# Patient Record
Sex: Female | Born: 1980 | Race: Black or African American | Hispanic: No | Marital: Single | Smoking: Never smoker
Health system: Southern US, Community
[De-identification: ages and names within clinical notes are randomized; demographics above are authoritative.]

## PROBLEM LIST (undated history)

## (undated) DIAGNOSIS — L309 Dermatitis, unspecified: Secondary | ICD-10-CM

## (undated) DIAGNOSIS — E669 Obesity, unspecified: Secondary | ICD-10-CM

## (undated) DIAGNOSIS — E079 Disorder of thyroid, unspecified: Secondary | ICD-10-CM

## (undated) DIAGNOSIS — I10 Essential (primary) hypertension: Secondary | ICD-10-CM

---

## 1999-01-19 ENCOUNTER — Emergency Department (HOSPITAL_COMMUNITY): Admission: EM | Admit: 1999-01-19 | Discharge: 1999-01-19 | Payer: Self-pay | Admitting: Emergency Medicine

## 2000-11-29 ENCOUNTER — Other Ambulatory Visit: Admission: RE | Admit: 2000-11-29 | Discharge: 2000-11-29 | Payer: Self-pay | Admitting: Obstetrics

## 2008-05-17 ENCOUNTER — Observation Stay (HOSPITAL_COMMUNITY): Admission: AD | Admit: 2008-05-17 | Discharge: 2008-05-18 | Payer: Self-pay | Admitting: Gynecology

## 2008-05-17 ENCOUNTER — Ambulatory Visit: Payer: Self-pay | Admitting: Gynecology

## 2009-11-07 ENCOUNTER — Emergency Department (HOSPITAL_BASED_OUTPATIENT_CLINIC_OR_DEPARTMENT_OTHER): Admission: EM | Admit: 2009-11-07 | Discharge: 2009-11-07 | Payer: Self-pay | Admitting: Emergency Medicine

## 2011-03-28 NOTE — Discharge Summary (Signed)
Cindy Le, Cindy Le               ACCOUNT NO.:  [id number]   MEDICAL RECORD NO.:  [id number]          PATIENT TYPE:  INP   LOCATION:  9303                          FACILITY:  [facility]   PHYSICIAN:  Ginger Carne, MD  DATE OF BIRTH:  October 06, 1981   DATE OF ADMISSION:  05/17/2008  DATE OF DISCHARGE:  05/18/2008                               DISCHARGE SUMMARY   REASON FOR HOSPITALIZATION:  Vaginal bleeding.   IN-HOSPITAL PROCEDURES:  Pelvic sonogram and quantitative hCG and CBC.   FINAL DIAGNOSIS:  Failed gestation.   HOSPITAL COURSE:  This patient is a 30 year old African American female  who presented to the Maternity Admission Unit on May 17, 2008, with  complaint of vaginal bleeding.  Pelvic sonogram revealed no evidence of  an intrauterine gestation.  There was no evidence of any adnexal masses  or free fluid in the pelvis.  The patient's quantitative hCG was 7488  and the hemoglobin of 11.9.  She denied abdominal pain, tenderness, or  lateralization of pain to either lower quadrant.  She is Rh positive.   The patient was observed during the evening and on the morning of May 18, 2008.  The patient had a repeat quantitative hCG, which went down to  4068 and a hemoglobin remained relatively stable from 11.9 on May 17, 2008 to 11.2 in the morning of May 18, 2008.  Her abdomen has remained  soft without tenderness.  She denies pelvic pain or lateralizing  discomfort.  She still has scant flow.   At this time, the patient will be discharged.  She had an appointment in  2 days to be followed by [facility] and I asked the  patient to contact them today and be followed up by their service in  approximately 1 week.  She was advised to return to the MAU if she has  severe lateralizing discomfort in the left lower quadrant to right lower  quadrant or generalized abdominal pain that is different from menstrual  cramping secondary to bleeding.  The patient understands,  although the  likelihood of an ectopic pregnancy is nil, it is not completely ruled  out, and the patient needs to take precautions in the event she develops  increasing pelvic pain.  She was asked to continue her medication for  hypothyroidism, levothyroxine 100 mcg daily.  All questions were  answered to the satisfaction of the patient.  The patient verbalized  understanding of same.      Ginger Carne, MD  Electronically Signed     SHB/MEDQ  D:  05/18/2008  T:  05/18/2008  Job:  [id number]

## 2011-08-10 LAB — URINE MICROSCOPIC-ADD ON

## 2011-08-10 LAB — CBC
HCT: 32.5 — ABNORMAL LOW
MCHC: 34.5
MCHC: 34.7
MCV: 98.7
Platelets: 233
Platelets: 273
RDW: 13.5

## 2011-08-10 LAB — URINALYSIS, ROUTINE W REFLEX MICROSCOPIC
Ketones, ur: 15 — AB
Leukocytes, UA: NEGATIVE
Nitrite: NEGATIVE
Specific Gravity, Urine: >1.03 — ABNORMAL HIGH
Urobilinogen, UA: 0.2
pH: 6

## 2011-08-10 LAB — HCG, QUANTITATIVE, PREGNANCY: hCG, Beta Chain, Quant, S: 7488 — ABNORMAL HIGH

## 2011-08-10 LAB — GC/CHLAMYDIA PROBE AMP, GENITAL
Chlamydia, DNA Probe: NEGATIVE
GC Probe Amp, Genital: NEGATIVE

## 2012-04-24 ENCOUNTER — Encounter (HOSPITAL_BASED_OUTPATIENT_CLINIC_OR_DEPARTMENT_OTHER): Payer: Self-pay | Admitting: *Deleted

## 2012-04-24 ENCOUNTER — Emergency Department (HOSPITAL_BASED_OUTPATIENT_CLINIC_OR_DEPARTMENT_OTHER)
Admission: EM | Admit: 2012-04-24 | Discharge: 2012-04-25 | Disposition: A | Discharge status: Home / Self Care | Payer: No Typology Code available for payment source | Attending: Emergency Medicine | Admitting: Emergency Medicine

## 2012-04-24 DIAGNOSIS — K645 Perianal venous thrombosis: Secondary | ICD-10-CM

## 2012-04-24 DIAGNOSIS — E079 Disorder of thyroid, unspecified: Secondary | ICD-10-CM | POA: Insufficient documentation

## 2012-04-24 HISTORY — DX: Disorder of thyroid, unspecified: E07.9

## 2012-04-24 NOTE — ED Notes (Signed)
Pt c/o abscess to rectal area x 3 days

## 2012-04-24 NOTE — ED Notes (Signed)
Pt reports boil at anus.  Denies pain with BM.  Reports pain when sitting. Denies any bleeding.  No hx of same.

## 2012-04-25 MED ORDER — LIDOCAINE HCL 4 % EX SOLN
CUTANEOUS | Status: AC
  Filled 2012-04-25: qty 50

## 2012-04-25 NOTE — Discharge Instructions (Signed)

## 2012-04-25 NOTE — ED Provider Notes (Signed)
History     CSN: [id number]  Arrival date & time 04/24/12  2332   First MD Initiated Contact with Patient 04/25/12 0000      Chief Complaint  Patient presents with  . Boil     (Consider location/radiation/quality/duration/timing/severity/associated sxs/prior treatment) HPI This is a 31 year old black female with a three-day history of what she believes is an abscess of her anus. There is swelling and tenderness, worse with palpation or movement. She has been able to move her bowels without significant pain but has been constipated. She denies a history of similar symptoms in the past. She has not done any specific treatment for this.  Past Medical History  Diagnosis Date  . Thyroid disease     History reviewed. No pertinent past surgical history.  History reviewed. No pertinent family history.  History  Substance Use Topics  . Smoking status: Never Smoker   . Smokeless tobacco: Not on file  . Alcohol Use: No    OB History    Grav Para Term Preterm Abortions TAB SAB Ect Mult Living                  Review of Systems  All other systems reviewed and are negative.    Allergies  Review of patient's allergies indicates no known allergies.  Home Medications   Current Outpatient Rx  Name Route Sig Dispense Refill  . LEVOTHYROXINE SODIUM 150 MCG PO TABS Oral Take 150 mcg by mouth daily.      BP 155/92  Pulse 100  Temp 98.8 F (37.1 C) (Oral)  Resp 16  Ht 5\' 4"  (1.626 m)  Wt 217 lb (98.431 kg)  BMI 37.25 kg/m2  SpO2 100%  LMP 03/27/2012  Physical Exam General: Well-developed, well-nourished female in no acute distress; appearance consistent with age of record HENT: normocephalic, atraumatic Eyes: pupils equal round and reactive to light; extraocular muscles intact Neck: supple Heart: regular rate and rhythm Lungs: clear to auscultation bilaterally Abdomen: soft; nondistended; bowel sounds present Rectal: Thrombosed hemorrhoid at 3:00 in the lithotomy  position Extremities: No deformity; full range of motion Neurologic: Awake, alert and oriented; motor function intact in all extremities and symmetric; no facial droop Skin: Warm and dry Psychiatric: Normal mood and affect    ED Course  Procedures (including critical care time)  INCISION AND DRAINAGE Performed by: Hanley Seamen Consent: Verbal consent obtained. Risks and benefits: risks, benefits and alternatives were discussed Type: Thrombosed hemorrhoid   Body area: Anus, 3:00 in the lithotomy position  Anesthesia: Topical application of 4% lidocaine solution; local infiltration of 2% lidocaine with epinephrine  Anesthetic total: 0.5 ml  Complexity: simple Elliptical section of roof of hemorrhoid removed with scissors; thrombus removed with forceps   Bleeding: Minimal  Patient tolerance: Patient tolerated the procedure well with no immediate complications.      MDM          Hanley Seamen, MD 04/25/12 [phone]

## 2012-11-18 ENCOUNTER — Emergency Department (HOSPITAL_BASED_OUTPATIENT_CLINIC_OR_DEPARTMENT_OTHER)
Admission: EM | Admit: 2012-11-18 | Discharge: 2012-11-18 | Disposition: A | Discharge status: Home / Self Care | Payer: No Typology Code available for payment source | Attending: Emergency Medicine | Admitting: Emergency Medicine

## 2012-11-18 ENCOUNTER — Encounter (HOSPITAL_BASED_OUTPATIENT_CLINIC_OR_DEPARTMENT_OTHER): Payer: Self-pay | Admitting: *Deleted

## 2012-11-18 DIAGNOSIS — R21 Rash and other nonspecific skin eruption: Secondary | ICD-10-CM | POA: Insufficient documentation

## 2012-11-18 DIAGNOSIS — T7840XA Allergy, unspecified, initial encounter: Secondary | ICD-10-CM

## 2012-11-18 DIAGNOSIS — L509 Urticaria, unspecified: Secondary | ICD-10-CM | POA: Insufficient documentation

## 2012-11-18 DIAGNOSIS — T4995XA Adverse effect of unspecified topical agent, initial encounter: Secondary | ICD-10-CM | POA: Insufficient documentation

## 2012-11-18 DIAGNOSIS — Z79899 Other long term (current) drug therapy: Secondary | ICD-10-CM | POA: Insufficient documentation

## 2012-11-18 DIAGNOSIS — L299 Pruritus, unspecified: Secondary | ICD-10-CM | POA: Insufficient documentation

## 2012-11-18 DIAGNOSIS — E079 Disorder of thyroid, unspecified: Secondary | ICD-10-CM | POA: Insufficient documentation

## 2012-11-18 MED ORDER — FAMOTIDINE 20 MG PO TABS
20.0000 mg | ORAL_TABLET | Freq: Once | ORAL | Status: AC
  Administered 2012-11-18: 20 mg via ORAL
  Filled 2012-11-18: qty 1

## 2012-11-18 MED ORDER — DIPHENHYDRAMINE HCL 25 MG PO CAPS
25.0000 mg | ORAL_CAPSULE | Freq: Once | ORAL | Status: AC
  Administered 2012-11-18: 25 mg via ORAL
  Filled 2012-11-18: qty 1

## 2012-11-18 MED ORDER — PREDNISONE 20 MG PO TABS: ORAL_TABLET | ORAL | Status: DC

## 2012-11-18 MED ORDER — PREDNISONE 50 MG PO TABS
60.0000 mg | ORAL_TABLET | Freq: Once | ORAL | Status: AC
  Administered 2012-11-18: 60 mg via ORAL
  Filled 2012-11-18: qty 1

## 2012-11-18 MED ORDER — FAMOTIDINE 20 MG PO TABS: 20.0000 mg | ORAL_TABLET | Freq: Two times a day (BID) | ORAL | Status: DC

## 2012-11-18 NOTE — ED Provider Notes (Signed)
History     CSN: [id number]  Arrival date & time 11/18/12  0143   First MD Initiated Contact with Patient 11/18/12 [phone]      Chief Complaint  Patient presents with  . Allergic Reaction    (Consider location/radiation/quality/duration/timing/severity/associated sxs/prior treatment) Patient is a 32 y.o. female presenting with allergic reaction. The history is provided by the patient.  Allergic Reaction The primary symptoms are  urticaria. The primary symptoms do not include wheezing, shortness of breath, cough, nausea or angioedema. The current episode started more than 2 days ago. The problem has not changed since onset.This is a new problem.  The urticaria began more than 2 days ago. The urticaria has been unchanged since its onset. Urticaria is a new problem. Urticaria is located on the left arm and right arm. The onset of urticaria was associated with scratching of the skin.  Associated with: unable to say. Significant symptoms also include itching. Significant symptoms that are not present include eye redness.    Past Medical History  Diagnosis Date  . Thyroid disease     History reviewed. No pertinent past surgical history.  No family history on file.  History  Substance Use Topics  . Smoking status: Never Smoker   . Smokeless tobacco: Not on file  . Alcohol Use: No    OB History    Grav Para Term Preterm Abortions TAB SAB Ect Mult Living                  Review of Systems  Eyes: Negative for redness.  Respiratory: Negative for cough, shortness of breath and wheezing.   Gastrointestinal: Negative for nausea.  Skin: Positive for itching.  All other systems reviewed and are negative.    Allergies  Review of patient's allergies indicates no known allergies.  Home Medications   Current Outpatient Rx  Name  Route  Sig  Dispense  Refill  . LEVOTHYROXINE SODIUM 150 MCG PO TABS   Oral   Take 150 mcg by mouth daily.           BP 153/97  Pulse 88  Temp  97.8 F (36.6 C) (Oral)  Resp 20  Ht 5\' 5"  (1.651 m)  Wt 230 lb (104.327 kg)  BMI 38.27 kg/m2  SpO2 100%  LMP 11/15/2012  Physical Exam  Constitutional: She is oriented to person, place, and time. She appears well-developed and well-nourished. No distress.  HENT:  Head: Normocephalic and atraumatic.  Mouth/Throat: Oropharynx is clear and moist.       No swelling of the lips tongue of uvula  Eyes: Conjunctivae normal are normal. Pupils are equal, round, and reactive to light.  Neck: Normal range of motion. Neck supple.  Cardiovascular: Normal rate, regular rhythm and intact distal pulses.   Pulmonary/Chest: Effort normal and breath sounds normal. No stridor. She has no wheezes. She has no rales.  Abdominal: Soft. Bowel sounds are normal. There is no tenderness. There is no rebound and no guarding.  Musculoskeletal: Normal range of motion. She exhibits no edema.  Neurological: She is alert and oriented to person, place, and time.  Skin: Skin is warm and dry. There is pallor.       Mild urticaria of B forearms  Psychiatric: She has a normal mood and affect.    ED Course  Procedures (including critical care time)  Labs Reviewed - No data to display No results found.   No diagnosis found.    MDM  Will treat as allergic  reaction follow up with your family doctor for ongoing care.  Return for worsening symptoms such as shortness of breath wheezing or swelling or the lips or tongue       Manish Ruggiero K Dona Walby-Rasch, MD 11/18/12 [phone]

## 2012-11-18 NOTE — ED Notes (Signed)
MD at bedside. 

## 2012-11-18 NOTE — ED Notes (Addendum)
Pt. States that she developed an allergic reaction type rash that started yesterday. Worse on arms bilateral. Faint  Redness and flat hives noted to arms bilateral. Pt. States rash is also on her legs bilateral. Denies sob. Denies any new products, foods or meds. Denies any fevers/sorethroat. Eyes swollen bilateral on exam.

## 2012-11-18 NOTE — ED Notes (Signed)
Rx x 2 given for pepcid and prednisone- instructed to take otc benadryl per EDP Palumbo

## 2012-11-30 ENCOUNTER — Encounter (HOSPITAL_BASED_OUTPATIENT_CLINIC_OR_DEPARTMENT_OTHER): Payer: Self-pay | Admitting: *Deleted

## 2012-11-30 ENCOUNTER — Emergency Department (HOSPITAL_BASED_OUTPATIENT_CLINIC_OR_DEPARTMENT_OTHER)
Admission: EM | Admit: 2012-11-30 | Discharge: 2012-11-30 | Disposition: A | Discharge status: Home / Self Care | Payer: No Typology Code available for payment source | Attending: Emergency Medicine | Admitting: Emergency Medicine

## 2012-11-30 DIAGNOSIS — R51 Headache: Secondary | ICD-10-CM | POA: Insufficient documentation

## 2012-11-30 DIAGNOSIS — R110 Nausea: Secondary | ICD-10-CM | POA: Insufficient documentation

## 2012-11-30 DIAGNOSIS — E039 Hypothyroidism, unspecified: Secondary | ICD-10-CM | POA: Insufficient documentation

## 2012-11-30 DIAGNOSIS — Z760 Encounter for issue of repeat prescription: Secondary | ICD-10-CM | POA: Insufficient documentation

## 2012-11-30 MED ORDER — LEVOTHYROXINE SODIUM 150 MCG PO TABS: 150.0000 ug | ORAL_TABLET | Freq: Every day | ORAL | Status: DC

## 2012-11-30 NOTE — ED Provider Notes (Signed)
History     CSN: [id number]  Arrival date & time 11/30/12  1203   First MD Initiated Contact with Patient 11/30/12 1329      Chief Complaint  Patient presents with  . Fatigue    (Consider location/radiation/quality/duration/timing/severity/associated sxs/prior treatment) HPI Comments: Patient with a history of thyroidectomy and hypothyroidism presents with 2 weeks of fatigue.  She in currently in the process of switching physicians and has been unable to get her prescription for synthroid 150 mcg.  She has not taken her medication in 2 weeks.  Patient states that she has been taking 150 mcg for the past 2 years.  She is experiencing headaches, fatigue, body aches, and constipation.  These symptoms are typical with how she feels when she does not have her medication and she has had these symptoms before..  She denies N/V/D or recent fevers or illness.  Onset has been gradual.  Nothing has helped alleviate symptoms.  Patient hopes to see her new physician in one to 2 weeks.  The history is provided by the patient.    Past Medical History  Diagnosis Date  . Thyroid disease     History reviewed. No pertinent past surgical history.  No family history on file.  History  Substance Use Topics  . Smoking status: Never Smoker   . Smokeless tobacco: Not on file  . Alcohol Use: No    OB History    Grav Para Term Preterm Abortions TAB SAB Ect Mult Living                  Review of Systems  Constitutional: Positive for fatigue. Negative for fever.  HENT: Negative for sore throat and rhinorrhea.   Eyes: Negative for pain and redness.  Respiratory: Negative for cough and shortness of breath.   Cardiovascular: Negative for chest pain.  Gastrointestinal: Positive for nausea and constipation. Negative for vomiting, abdominal pain, diarrhea and blood in stool.  Genitourinary: Negative for dysuria.  Musculoskeletal: Positive for arthralgias. Negative for myalgias.  Skin: Negative for  rash.  Neurological: Positive for headaches. Negative for dizziness and light-headedness.    Allergies  Review of patient's allergies indicates no known allergies.  Home Medications   Current Outpatient Rx  Name  Route  Sig  Dispense  Refill  . FAMOTIDINE 20 MG PO TABS   Oral   Take 1 tablet (20 mg total) by mouth 2 (two) times daily.   10 tablet   0   . LEVOTHYROXINE SODIUM 150 MCG PO TABS   Oral   Take 150 mcg by mouth daily.         Marland Kitchen PREDNISONE 20 MG PO TABS      3 tabs po day one, then 2 po daily x 4 days   11 tablet   0     BP 136/104  Pulse 87  Temp 98 F (36.7 C) (Oral)  Resp 18  SpO2 100%  LMP 11/15/2012  Physical Exam  Nursing note and vitals reviewed. Constitutional: She is oriented to person, place, and time. She appears well-developed and well-nourished.  HENT:  Head: Normocephalic and atraumatic.  Eyes: Conjunctivae normal and EOM are normal. Right eye exhibits no discharge. Left eye exhibits no discharge.  Neck: Normal range of motion. Neck supple.  Cardiovascular: Normal rate, regular rhythm and normal heart sounds.   Pulmonary/Chest: Effort normal and breath sounds normal.  Abdominal: Soft. Bowel sounds are normal. She exhibits no distension. There is no tenderness.  Neurological: She  is alert and oriented to person, place, and time.  Skin: Skin is warm and dry.  Psychiatric: She has a normal mood and affect.    ED Course  Procedures (including critical care time)  Labs Reviewed - No data to display No results found.   1. Hypothyroidism   2. Medication refill     1:48 PM Patient seen and examined. Provided prescription for levothyroxine.   Vital signs reviewed and are as follows: Filed Vitals:   11/30/12 1212  BP: 136/104  Pulse: 87  Temp: 98 F (36.7 C)  Resp: 18   Patient urged to return with worsening symptoms or other concerns. Patient verbalized understanding and agrees with plan. Urged to return with the symptoms that  are not usual for her low thyroid.     MDM  No signs of serious problem from being off of her medication. No myxedema coma. Patient provided 3 weeks of medication at her previous dose. She appears well.        Renne Crigler, [place] 11/30/12 1420

## 2012-11-30 NOTE — ED Notes (Addendum)
Patient states that she is getting a new primary MD and currently out of her thyroid medicine & requesting a refill, ran out two weeks ago. Fatigue & HA today, took ibuprofen this morning

## 2012-11-30 NOTE — ED Provider Notes (Signed)
Medical screening examination/treatment/procedure(s) were performed by non-physician practitioner and as supervising physician I was immediately available for consultation/collaboration.   Mccartney Brucks, MD 11/30/12 1503 

## 2013-02-05 ENCOUNTER — Encounter (HOSPITAL_BASED_OUTPATIENT_CLINIC_OR_DEPARTMENT_OTHER): Payer: Self-pay

## 2013-02-05 ENCOUNTER — Emergency Department (HOSPITAL_BASED_OUTPATIENT_CLINIC_OR_DEPARTMENT_OTHER)
Admission: EM | Admit: 2013-02-05 | Discharge: 2013-02-05 | Disposition: A | Discharge status: Home / Self Care | Payer: No Typology Code available for payment source | Attending: Emergency Medicine | Admitting: Emergency Medicine

## 2013-02-05 DIAGNOSIS — E669 Obesity, unspecified: Secondary | ICD-10-CM | POA: Insufficient documentation

## 2013-02-05 DIAGNOSIS — Z79899 Other long term (current) drug therapy: Secondary | ICD-10-CM | POA: Insufficient documentation

## 2013-02-05 DIAGNOSIS — R21 Rash and other nonspecific skin eruption: Secondary | ICD-10-CM | POA: Insufficient documentation

## 2013-02-05 DIAGNOSIS — L299 Pruritus, unspecified: Secondary | ICD-10-CM | POA: Insufficient documentation

## 2013-02-05 DIAGNOSIS — T7840XA Allergy, unspecified, initial encounter: Secondary | ICD-10-CM

## 2013-02-05 DIAGNOSIS — E079 Disorder of thyroid, unspecified: Secondary | ICD-10-CM | POA: Insufficient documentation

## 2013-02-05 HISTORY — DX: Obesity, unspecified: E66.9

## 2013-02-05 MED ORDER — PREDNISONE 50 MG PO TABS
60.0000 mg | ORAL_TABLET | Freq: Once | ORAL | Status: AC
  Administered 2013-02-05: 60 mg via ORAL
  Filled 2013-02-05: qty 1

## 2013-02-05 MED ORDER — PREDNISONE 20 MG PO TABS: 40.0000 mg | ORAL_TABLET | Freq: Every day | ORAL | Status: DC

## 2013-02-05 NOTE — ED Provider Notes (Addendum)
History     CSN: [id number]  Arrival date & time 02/05/13  2136   First MD Initiated Contact with Patient 02/05/13 2146      Chief Complaint  Patient presents with  . Allergic Reaction    (Consider location/radiation/quality/duration/timing/severity/associated sxs/prior treatment) Patient is a 32 y.o. female presenting with allergic reaction. The history is provided by the patient.  Allergic Reaction The primary symptoms are  rash. The primary symptoms do not include wheezing, shortness of breath, angioedema or urticaria. Episode onset: 5 days ago. The problem has been gradually worsening. This is a recurrent problem.  The rash is associated with itching.  Associated with: No new exposure. Significant symptoms also include itching.    Past Medical History  Diagnosis Date  . Thyroid disease   . Obesity     History reviewed. No pertinent past surgical history.  History reviewed. No pertinent family history.  History  Substance Use Topics  . Smoking status: Never Smoker   . Smokeless tobacco: Not on file  . Alcohol Use: Yes    OB History   Grav Para Term Preterm Abortions TAB SAB Ect Mult Living                  Review of Systems  Respiratory: Negative for shortness of breath and wheezing.   Skin: Positive for itching and rash.  All other systems reviewed and are negative.    Allergies  Review of patient's allergies indicates no known allergies.  Home Medications   Current Outpatient Rx  Name  Route  Sig  Dispense  Refill  . diphenhydrAMINE (BENADRYL) 25 MG tablet   Oral   Take 25 mg by mouth every 6 (six) hours as needed for itching.         . levothyroxine (SYNTHROID, LEVOTHROID) 150 MCG tablet   Oral   Take 1 tablet (150 mcg total) by mouth daily.   21 tablet   0   . famotidine (PEPCID) 20 MG tablet   Oral   Take 1 tablet (20 mg total) by mouth 2 (two) times daily.   10 tablet   0   . predniSONE (DELTASONE) 20 MG tablet      3 tabs po day  one, then 2 po daily x 4 days   11 tablet   0     BP 145/103  Pulse 80  Temp(Src) 98.7 F (37.1 C) (Oral)  Resp 18  Ht 5\' 6"  (1.676 m)  Wt 220 lb (99.791 kg)  BMI 35.53 kg/m2  SpO2 99%  LMP 01/29/2013  Physical Exam  Nursing note and vitals reviewed. Constitutional: She appears well-developed and well-nourished. No distress.  HENT:  Head: Normocephalic and atraumatic.  Eyes: EOM are normal. Pupils are equal, round, and reactive to light.  Cardiovascular: Normal rate.   Pulmonary/Chest: Effort normal.  Neurological: She is alert.  Skin: Skin is warm and dry. Rash noted. Rash is maculopapular.  Maculopapular rash involving the face, trunk and upper and lower extremities. Blanches and no induration or fluctuance or signs of secondary infection  Psychiatric: She has a normal mood and affect. Her behavior is normal.    ED Course  Procedures (including critical care time)  Labs Reviewed - No data to display No results found.   1. Allergic reaction, initial encounter       MDM   Patient with a rash involving her upper and lower extremities as well as face. Allergic-looking rash without any signs of infectious component. No  mouth or airway involvement. Patient does not know the cause but it's been ongoing since Saturday and worsening. Benadryl with some relief but not complete. Will start on steroids and she has an appointment with an allergist on next Thursday        Gwyneth Sprout, MD 02/05/13 2155  Gwyneth Sprout, MD 02/05/13 2157

## 2013-02-05 NOTE — ED Notes (Signed)
Pt states that she has been experiencing rash, erythema to face, arm, chest, and trunk.  Pt denies angioedema, sob, etc.  In nad at this time,  Taking benadryl at home with no relief.  Onset Saturday, reoccurring since then.

## 2014-12-31 ENCOUNTER — Emergency Department (HOSPITAL_BASED_OUTPATIENT_CLINIC_OR_DEPARTMENT_OTHER)
Admission: EM | Admit: 2014-12-31 | Discharge: 2014-12-31 | Disposition: A | Discharge status: Home / Self Care | Payer: No Typology Code available for payment source | Attending: Emergency Medicine | Admitting: Emergency Medicine

## 2014-12-31 ENCOUNTER — Encounter (HOSPITAL_BASED_OUTPATIENT_CLINIC_OR_DEPARTMENT_OTHER): Payer: Self-pay

## 2014-12-31 DIAGNOSIS — Z3202 Encounter for pregnancy test, result negative: Secondary | ICD-10-CM | POA: Diagnosis not present

## 2014-12-31 DIAGNOSIS — Z79899 Other long term (current) drug therapy: Secondary | ICD-10-CM | POA: Diagnosis not present

## 2014-12-31 DIAGNOSIS — R300 Dysuria: Secondary | ICD-10-CM | POA: Diagnosis present

## 2014-12-31 DIAGNOSIS — E669 Obesity, unspecified: Secondary | ICD-10-CM | POA: Diagnosis not present

## 2014-12-31 DIAGNOSIS — E079 Disorder of thyroid, unspecified: Secondary | ICD-10-CM | POA: Insufficient documentation

## 2014-12-31 DIAGNOSIS — Z7952 Long term (current) use of systemic steroids: Secondary | ICD-10-CM | POA: Insufficient documentation

## 2014-12-31 DIAGNOSIS — N390 Urinary tract infection, site not specified: Secondary | ICD-10-CM

## 2014-12-31 LAB — URINALYSIS, ROUTINE W REFLEX MICROSCOPIC
Bilirubin Urine: NEGATIVE
GLUCOSE, UA: NEGATIVE mg/dL
Hgb urine dipstick: NEGATIVE
KETONES UR: 40 mg/dL — AB
NITRITE: NEGATIVE
PH: 6 (ref 5.0–8.0)
Protein, ur: NEGATIVE mg/dL
SPECIFIC GRAVITY, URINE: 1.019 (ref 1.005–1.030)
Urobilinogen, UA: 0.2 mg/dL (ref 0.0–1.0)

## 2014-12-31 LAB — URINE MICROSCOPIC-ADD ON

## 2014-12-31 LAB — PREGNANCY, URINE: Preg Test, Ur: NEGATIVE

## 2014-12-31 MED ORDER — PHENAZOPYRIDINE HCL 100 MG PO TABS
200.0000 mg | ORAL_TABLET | Freq: Once | ORAL | Status: AC
  Administered 2014-12-31: 200 mg via ORAL
  Filled 2014-12-31: qty 2

## 2014-12-31 MED ORDER — NITROFURANTOIN MONOHYD MACRO 100 MG PO CAPS
100.0000 mg | ORAL_CAPSULE | Freq: Once | ORAL | Status: AC
  Administered 2014-12-31: 100 mg via ORAL
  Filled 2014-12-31: qty 1

## 2014-12-31 MED ORDER — PHENAZOPYRIDINE HCL 200 MG PO TABS: 200.0000 mg | ORAL_TABLET | Freq: Three times a day (TID) | ORAL | Status: DC

## 2014-12-31 MED ORDER — NITROFURANTOIN MONOHYD MACRO 100 MG PO CAPS: 100.0000 mg | ORAL_CAPSULE | Freq: Two times a day (BID) | ORAL | Status: DC

## 2014-12-31 NOTE — ED Provider Notes (Signed)
CSN: [id number]     Arrival date & time 12/31/14  0016 History   First MD Initiated Contact with Patient 12/31/14 0110     Chief Complaint  Patient presents with  . Urinary Tract Infection     (Consider location/radiation/quality/duration/timing/severity/associated sxs/prior Treatment) Patient is a 34 y.o. female presenting with urinary tract infection. The history is provided by the patient.  Urinary Tract Infection This is a new problem. The current episode started more than 1 week ago. The problem occurs constantly. The problem has not changed since onset.Pertinent negatives include no chest pain, no abdominal pain, no headaches and no shortness of breath. Nothing aggravates the symptoms. Nothing relieves the symptoms. She has tried nothing for the symptoms. The treatment provided no relief.  mild dysuria no discharge no n/v/d no f/c/r  Past Medical History  Diagnosis Date  . Thyroid disease   . Obesity    History reviewed. No pertinent past surgical history. History reviewed. No pertinent family history. History  Substance Use Topics  . Smoking status: Never Smoker   . Smokeless tobacco: Not on file  . Alcohol Use: Yes   OB History    No data available     Review of Systems  Constitutional: Negative for fever.  Respiratory: Negative for shortness of breath.   Cardiovascular: Negative for chest pain.  Gastrointestinal: Negative for abdominal pain.  Genitourinary: Positive for dysuria. Negative for flank pain and vaginal discharge.  Neurological: Negative for headaches.  All other systems reviewed and are negative.     Allergies  Review of patient's allergies indicates no known allergies.  Home Medications   Prior to Admission medications   Medication Sig Start Date End Date Taking? Authorizing Provider  diphenhydrAMINE (BENADRYL) 25 MG tablet Take 25 mg by mouth every 6 (six) hours as needed for itching.    Historical Provider, MD  famotidine (PEPCID) 20 MG  tablet Take 1 tablet (20 mg total) by mouth 2 (two) times daily. 11/18/12   Najib Colmenares K Alexianna Nachreiner-Rasch, MD  levothyroxine (SYNTHROID, LEVOTHROID) 150 MCG tablet Take 1 tablet (150 mcg total) by mouth daily. 11/30/12   Carlisle Cater, PA-C  predniSONE (DELTASONE) 20 MG tablet 3 tabs po day one, then 2 po daily x 4 days 11/18/12   Harsh Trulock K Alson Mcpheeters-Rasch, MD  predniSONE (DELTASONE) 20 MG tablet Take 2 tablets (40 mg total) by mouth daily. Start 02/06/13 02/05/13   Blanchie Dessert, MD   BP 152/98 mmHg  Pulse 93  Temp(Src) 98.5 F (36.9 C) (Oral)  Resp 16  SpO2 100%  LMP 12/23/2014 Physical Exam  Constitutional: She is oriented to person, place, and time. She appears well-developed and well-nourished. No distress.  HENT:  Head: Normocephalic and atraumatic.  Mouth/Throat: Oropharynx is clear and moist.  Eyes: Conjunctivae and EOM are normal. Pupils are equal, round, and reactive to light.  Neck: Normal range of motion. Neck supple.  Cardiovascular: Normal rate, regular rhythm and intact distal pulses.   Pulmonary/Chest: Effort normal and breath sounds normal. No respiratory distress. She has no wheezes. She has no rales.  Abdominal: Soft. Bowel sounds are normal. There is no tenderness. There is no rebound and no guarding.  Musculoskeletal: Normal range of motion.  Neurological: She is alert and oriented to person, place, and time. She has normal reflexes.  Skin: Skin is warm and dry.  Psychiatric: She has a normal mood and affect.    ED Course  Procedures (including critical care time) Labs Review Labs Reviewed  URINALYSIS, ROUTINE W  REFLEX MICROSCOPIC - Abnormal; Notable for the following:    APPearance CLOUDY (*)    Ketones, ur 40 (*)    Leukocytes, UA SMALL (*)    All other components within normal limits  URINE MICROSCOPIC-ADD ON - Abnormal; Notable for the following:    Squamous Epithelial / LPF FEW (*)    Bacteria, UA MANY (*)    All other components within normal limits  PREGNANCY,  URINE    Imaging Review No results found.   EKG Interpretation None      MDM   Final diagnoses:  None    UTI, macrobid x 3 days for uti.  Pyridium for pain and condoms for one full month as antibiotics can invalidate contraceptives.  Patient verbalizes understanding of all instructions    Tukker Byrns K Genie Wenke-Rasch, MD 12/31/14 [phone]

## 2014-12-31 NOTE — ED Notes (Signed)
Burning w urination,  Denies dc  Onset 1 week ago

## 2014-12-31 NOTE — ED Notes (Signed)
Pt c/o burning on urination, not constant, states had unprotective intercourse in dec/15; denies vaginal discharge or smell

## 2015-02-11 ENCOUNTER — Emergency Department (HOSPITAL_BASED_OUTPATIENT_CLINIC_OR_DEPARTMENT_OTHER)
Admission: EM | Admit: 2015-02-11 | Discharge: 2015-02-11 | Disposition: A | Discharge status: Home / Self Care | Payer: No Typology Code available for payment source | Attending: Emergency Medicine | Admitting: Emergency Medicine

## 2015-02-11 ENCOUNTER — Encounter (HOSPITAL_BASED_OUTPATIENT_CLINIC_OR_DEPARTMENT_OTHER): Payer: Self-pay

## 2015-02-11 DIAGNOSIS — Z79899 Other long term (current) drug therapy: Secondary | ICD-10-CM | POA: Diagnosis not present

## 2015-02-11 DIAGNOSIS — E079 Disorder of thyroid, unspecified: Secondary | ICD-10-CM | POA: Diagnosis not present

## 2015-02-11 DIAGNOSIS — M25561 Pain in right knee: Secondary | ICD-10-CM | POA: Insufficient documentation

## 2015-02-11 DIAGNOSIS — E669 Obesity, unspecified: Secondary | ICD-10-CM | POA: Insufficient documentation

## 2015-02-11 MED ORDER — NAPROXEN 500 MG PO TABS: 500.0000 mg | ORAL_TABLET | Freq: Two times a day (BID) | ORAL | Status: DC

## 2015-02-11 NOTE — ED Provider Notes (Signed)
CSN: [id number]     Arrival date & time 02/11/15  1919 History   First MD Initiated Contact with Patient 02/11/15 2044     Chief Complaint  Patient presents with  . Leg Pain     (Consider location/radiation/quality/duration/timing/severity/associated sxs/prior Treatment) HPI Cindy Le is a 34 y.o. female who comes in for evaluation for concern for DVT. Patient states approximately 2 months ago she was working out when she did a repeat and felt a pain in her right knee. She reports intermittent right knee discomfort since then and is concerned that she may have a DVT. She has not tried anything to improve her symptoms. Nothing seems to make the symptoms better or worse. The pain does not radiate. She denies any fevers at home, recent travel or surgeries, history of DVT, unilateral leg swelling, exogenous estrogen, hemoptysis. Denies any respiratory symptoms, chest pain, shortness of breath, cough.  Past Medical History  Diagnosis Date  . Thyroid disease   . Obesity    History reviewed. No pertinent past surgical history. No family history on file. History  Substance Use Topics  . Smoking status: Never Smoker   . Smokeless tobacco: Not on file  . Alcohol Use: Yes   OB History    No data available     Review of Systems  Constitutional: Negative for fever.  Respiratory: Negative for shortness of breath.   Cardiovascular: Negative for chest pain.  Musculoskeletal: Positive for arthralgias. Negative for back pain and gait problem.  Skin: Negative for rash.  Neurological: Negative for weakness and numbness.      Allergies  Review of patient's allergies indicates no known allergies.  Home Medications   Prior to Admission medications   Medication Sig Start Date End Date Taking? Authorizing Provider  diphenhydrAMINE (BENADRYL) 25 MG tablet Take 25 mg by mouth every 6 (six) hours as needed for itching.    Historical Provider, MD  famotidine (PEPCID) 20 MG tablet Take 1  tablet (20 mg total) by mouth 2 (two) times daily. 11/18/12   April Palumbo, MD  levothyroxine (SYNTHROID, LEVOTHROID) 150 MCG tablet Take 1 tablet (150 mcg total) by mouth daily. 11/30/12   Carlisle Cater, PA-C  naproxen (NAPROSYN) 500 MG tablet Take 1 tablet (500 mg total) by mouth 2 (two) times daily. 02/11/15   Comer Locket, PA-C  nitrofurantoin, macrocrystal-monohydrate, (MACROBID) 100 MG capsule Take 1 capsule (100 mg total) by mouth 2 (two) times daily. X 3 days 12/31/14   April Palumbo, MD  phenazopyridine (PYRIDIUM) 200 MG tablet Take 1 tablet (200 mg total) by mouth 3 (three) times daily. 12/31/14   April Palumbo, MD  predniSONE (DELTASONE) 20 MG tablet 3 tabs po day one, then 2 po daily x 4 days 11/18/12   April Palumbo, MD  predniSONE (DELTASONE) 20 MG tablet Take 2 tablets (40 mg total) by mouth daily. Start 02/06/13 02/05/13   Blanchie Dessert, MD   BP 156/108 mmHg  Pulse 70  Temp(Src) 98.3 F (36.8 C) (Oral)  Resp 16  Ht 5\' 5"  (1.651 m)  Wt 204 lb (92.534 kg)  BMI 33.95 kg/m2  SpO2 100%  LMP 02/08/2015 Physical Exam  Constitutional:  Awake, alert, nontoxic appearance.  HENT:  Head: Atraumatic.  Eyes: Right eye exhibits no discharge. Left eye exhibits no discharge.  Neck: Neck supple.  Pulmonary/Chest: Effort normal. She exhibits no tenderness.  Abdominal: Soft. There is no tenderness. There is no rebound.  Musculoskeletal: She exhibits no tenderness.  Baseline ROM, no obvious  new focal weakness. No tenderness to bilateral lower extremities. Negative Homans sign. No tenderness to deep venous system. No obvious skin discolorations. No unilateral leg swelling. No evidence clinically of DVT  Neurological:  Mental status and motor strength appears baseline for patient and situation.  Skin: No rash noted.  Psychiatric: She has a normal mood and affect.  Nursing note and vitals reviewed.   ED Course  Procedures (including critical care time) Labs Review Labs Reviewed - No data  to display  Imaging Review No results found.   EKG Interpretation None     Meds given in ED:  Medications - No data to display  Discharge Medication List as of 02/11/2015  9:25 PM    START taking these medications   Details  naproxen (NAPROSYN) 500 MG tablet Take 1 tablet (500 mg total) by mouth 2 (two) times daily., Starting 02/11/2015, Until Discontinued, Print       Filed Vitals:   02/11/15 1940 02/11/15 2141  BP: 129/112 156/108  Pulse: 77 70  Temp: 98.3 F (36.8 C)   TempSrc: Oral   Resp: 18 16  Height: 5\' 5"  (1.651 m)   Weight: 204 lb (92.534 kg)   SpO2: 100% 100%    MDM  Vitals stable - WNL -afebrile Pt resting comfortably in ED. PE--Mild tenderness to R knee diffusely. Full ROM and NVI. No erythema, warmth, edema  DDX--Symptoms likely due to MSK strain. PERC neg. No evidence of DVT, frx, dislocation, neurovascular compromise, hemarthrosis, septic arthritis. No difficulties with ambulation. Will DC with Naproxen, Rice therapy I discussed all relevant lab findings and imaging results with pt and they verbalized understanding. Discussed f/u with PCP within 48 hrs and return precautions, pt very amenable to plan.  Final diagnoses:  Right knee pain        Comer Locket, PA-C 02/12/15 Ronald, MD 02/13/15 2307

## 2015-02-11 NOTE — Discharge Instructions (Signed)
Arthralgia °Your caregiver has diagnosed you as suffering from an arthralgia. Arthralgia means there is pain in a joint. This can come from many reasons including: °· Bruising the joint which causes soreness (inflammation) in the joint. °· Wear and tear on the joints which occur as we grow older (osteoarthritis). °· Overusing the joint. °· Various forms of arthritis. °· Infections of the joint. °Regardless of the cause of pain in your joint, most of these different pains respond to anti-inflammatory drugs and rest. The exception to this is when a joint is infected, and these cases are treated with antibiotics, if it is a bacterial infection. °HOME CARE INSTRUCTIONS  °· Rest the injured area for as long as directed by your caregiver. Then slowly start using the joint as directed by your caregiver and as the pain allows. Crutches as directed may be useful if the ankles, knees or hips are involved. If the knee was splinted or casted, continue use and care as directed. If an stretchy or elastic wrapping bandage has been applied today, it should be removed and re-applied every 3 to 4 hours. It should not be applied tightly, but firmly enough to keep swelling down. Watch toes and feet for swelling, bluish discoloration, coldness, numbness or excessive pain. If any of these problems (symptoms) occur, remove the ace bandage and re-apply more loosely. If these symptoms persist, contact your caregiver or return to this location. °· For the first 24 hours, keep the injured extremity elevated on pillows while lying down. °· Apply ice for 15-20 minutes to the sore joint every couple hours while awake for the first half day. Then 03-04 times per day for the first 48 hours. Put the ice in a plastic bag and place a towel between the bag of ice and your skin. °· Wear any splinting, casting, elastic bandage applications, or slings as instructed. °· Only take over-the-counter or prescription medicines for pain, discomfort, or fever as  directed by your caregiver. Do not use aspirin immediately after the injury unless instructed by your physician. Aspirin can cause increased bleeding and bruising of the tissues. °· If you were given crutches, continue to use them as instructed and do not resume weight bearing on the sore joint until instructed. °Persistent pain and inability to use the sore joint as directed for more than 2 to 3 days are warning signs indicating that you should see a caregiver for a follow-up visit as soon as possible. Initially, a hairline fracture (break in bone) may not be evident on X-rays. Persistent pain and swelling indicate that further evaluation, non-weight bearing or use of the joint (use of crutches or slings as instructed), or further X-rays are indicated. X-rays may sometimes not show a small fracture until a week or 10 days later. Make a follow-up appointment with your own caregiver or one to whom we have referred you. A radiologist (specialist in reading X-rays) may read your X-rays. Make sure you know how you are to obtain your X-ray results. Do not assume everything is normal if you do not hear from us. °SEEK MEDICAL CARE IF: °Bruising, swelling, or pain increases. °SEEK IMMEDIATE MEDICAL CARE IF:  °· Your fingers or toes are numb or blue. °· The pain is not responding to medications and continues to stay the same or get worse. °· The pain in your joint becomes severe. °· You develop a fever over 102° F (38.9° C). °· It becomes impossible to move or use the joint. °MAKE SURE YOU:  °·   Understand these instructions.  Will watch your condition.  Will get help right away if you are not doing well or get worse. Document Released: 10/30/2005 Document Revised: 01/22/2012 Document Reviewed: 06/17/2008 [facility] Patient Information 2015 [place], [place]. This information is not intended to replace advice given to you by your health care provider. Make sure you discuss any questions you have with your health care  provider.  Knee Pain The knee is the complex joint between your thigh and your lower leg. It is made up of bones, tendons, ligaments, and cartilage. The bones that make up the knee are:  The femur in the thigh.  The tibia and fibula in the lower leg.  The patella or kneecap riding in the groove on the lower femur. CAUSES  Knee pain is a common complaint with many causes. A few of these causes are:  Injury, such as:  A ruptured ligament or tendon injury.  Torn cartilage.  Medical conditions, such as:  Gout  Arthritis  Infections  Overuse, over training, or overdoing a physical activity. Knee pain can be minor or severe. Knee pain can accompany debilitating injury. Minor knee problems often respond well to self-care measures or get well on their own. More serious injuries may need medical intervention or even surgery. SYMPTOMS The knee is complex. Symptoms of knee problems can vary widely. Some of the problems are:  Pain with movement and weight bearing.  Swelling and tenderness.  Buckling of the knee.  Inability to straighten or extend your knee.  Your knee locks and you cannot straighten it.  Warmth and redness with pain and fever.  Deformity or dislocation of the kneecap. DIAGNOSIS  Determining what is wrong may be very straight forward such as when there is an injury. It can also be challenging because of the complexity of the knee. Tests to make a diagnosis may include:  Your caregiver taking a history and doing a physical exam.  Routine X-rays can be used to rule out other problems. X-rays will not reveal a cartilage tear. Some injuries of the knee can be diagnosed by:  Arthroscopy a surgical technique by which a small video camera is inserted through tiny incisions on the sides of the knee. This procedure is used to examine and repair internal knee joint problems. Tiny instruments can be used during arthroscopy to repair the torn knee cartilage  (meniscus).  Arthrography is a radiology technique. A contrast liquid is directly injected into the knee joint. Internal structures of the knee joint then become visible on X-ray film.  An MRI scan is a non X-ray radiology procedure in which magnetic fields and a computer produce two- or three-dimensional images of the inside of the knee. Cartilage tears are often visible using an MRI scanner. MRI scans have largely replaced arthrography in diagnosing cartilage tears of the knee.  Blood work.  Examination of the fluid that helps to lubricate the knee joint (synovial fluid). This is done by taking a sample out using a needle and a syringe. TREATMENT The treatment of knee problems depends on the cause. Some of these treatments are:  Depending on the injury, proper casting, splinting, surgery, or physical therapy care will be needed.  Give yourself adequate recovery time. Do not overuse your joints. If you begin to get sore during workout routines, back off. Slow down or do fewer repetitions.  For repetitive activities such as cycling or running, maintain your strength and nutrition.  Alternate muscle groups. For example, if you are a  weight lifter, work the upper body on one day and the lower body the next.  Either tight or weak muscles do not give the proper support for your knee. Tight or weak muscles do not absorb the stress placed on the knee joint. Keep the muscles surrounding the knee strong.  Take care of mechanical problems.  If you have flat feet, orthotics or special shoes may help. See your caregiver if you need help.  Arch supports, sometimes with wedges on the inner or outer aspect of the heel, can help. These can shift pressure away from the side of the knee most bothered by osteoarthritis.  A brace called an "unloader" brace also may be used to help ease the pressure on the most arthritic side of the knee.  If your caregiver has prescribed crutches, braces, wraps or ice,  use as directed. The acronym for this is PRICE. This means protection, rest, ice, compression, and elevation.  Nonsteroidal anti-inflammatory drugs (NSAIDs), can help relieve pain. But if taken immediately after an injury, they may actually increase swelling. Take NSAIDs with food in your stomach. Stop them if you develop stomach problems. Do not take these if you have a history of ulcers, stomach pain, or bleeding from the bowel. Do not take without your caregiver's approval if you have problems with fluid retention, heart failure, or kidney problems.  For ongoing knee problems, physical therapy may be helpful.  Glucosamine and chondroitin are over-the-counter dietary supplements. Both may help relieve the pain of osteoarthritis in the knee. These medicines are different from the usual anti-inflammatory drugs. Glucosamine may decrease the rate of cartilage destruction.  Injections of a corticosteroid drug into your knee joint may help reduce the symptoms of an arthritis flare-up. They may provide pain relief that lasts a few months. You may have to wait a few months between injections. The injections do have a small increased risk of infection, water retention, and elevated blood sugar levels.  Hyaluronic acid injected into damaged joints may ease pain and provide lubrication. These injections may work by reducing inflammation. A series of shots may give relief for as long as 6 months.  Topical painkillers. Applying certain ointments to your skin may help relieve the pain and stiffness of osteoarthritis. Ask your pharmacist for suggestions. Many over the-counter products are approved for temporary relief of arthritis pain.  In some countries, doctors often prescribe topical NSAIDs for relief of chronic conditions such as arthritis and tendinitis. A review of treatment with NSAID creams found that they worked as well as oral medications but without the serious side effects. PREVENTION  Maintain a  healthy weight. Extra pounds put more strain on your joints.  Get strong, stay limber. Weak muscles are a common cause of knee injuries. Stretching is important. Include flexibility exercises in your workouts.  Be smart about exercise. If you have osteoarthritis, chronic knee pain or recurring injuries, you may need to change the way you exercise. This does not mean you have to stop being active. If your knees ache after jogging or playing basketball, consider switching to swimming, water aerobics, or other low-impact activities, at least for a few days a week. Sometimes limiting high-impact activities will provide relief.  Make sure your shoes fit well. Choose footwear that is right for your sport.  Protect your knees. Use the proper gear for knee-sensitive activities. Use kneepads when playing volleyball or laying carpet. Buckle your seat belt every time you drive. Most shattered kneecaps occur in car accidents.  Rest when you are tired. SEEK MEDICAL CARE IF:  You have knee pain that is continual and does not seem to be getting better.  SEEK IMMEDIATE MEDICAL CARE IF:  Your knee joint feels hot to the touch and you have a high fever. MAKE SURE YOU:   Understand these instructions.  Will watch your condition.  Will get help right away if you are not doing well or get worse. Document Released: 08/27/2007 Document Revised: 01/22/2012 Document Reviewed: 08/27/2007 [facility] Patient Information 2015 [place], [place]. This information is not intended to replace advice given to you by your health care provider. Make sure you discuss any questions you have with your health care provider.

## 2015-02-11 NOTE — ED Notes (Signed)
Pt states 41months ago was working out, felt a pop to rt knee/upper leg area; states pain/discomfort off and on, worried about having a DVT; no hx of same

## 2015-08-13 ENCOUNTER — Encounter (HOSPITAL_BASED_OUTPATIENT_CLINIC_OR_DEPARTMENT_OTHER): Payer: Self-pay

## 2015-08-13 ENCOUNTER — Emergency Department (HOSPITAL_BASED_OUTPATIENT_CLINIC_OR_DEPARTMENT_OTHER)
Admission: EM | Admit: 2015-08-13 | Discharge: 2015-08-13 | Disposition: A | Discharge status: Home / Self Care | Payer: No Typology Code available for payment source | Attending: Emergency Medicine | Admitting: Emergency Medicine

## 2015-08-13 DIAGNOSIS — Z3202 Encounter for pregnancy test, result negative: Secondary | ICD-10-CM | POA: Diagnosis not present

## 2015-08-13 DIAGNOSIS — Z79899 Other long term (current) drug therapy: Secondary | ICD-10-CM | POA: Diagnosis not present

## 2015-08-13 DIAGNOSIS — N72 Inflammatory disease of cervix uteri: Secondary | ICD-10-CM | POA: Insufficient documentation

## 2015-08-13 DIAGNOSIS — R300 Dysuria: Secondary | ICD-10-CM | POA: Diagnosis present

## 2015-08-13 DIAGNOSIS — E669 Obesity, unspecified: Secondary | ICD-10-CM | POA: Insufficient documentation

## 2015-08-13 DIAGNOSIS — E079 Disorder of thyroid, unspecified: Secondary | ICD-10-CM | POA: Insufficient documentation

## 2015-08-13 LAB — WET PREP, GENITAL
Clue Cells Wet Prep HPF POC: NONE SEEN
TRICH WET PREP: NONE SEEN
YEAST WET PREP: NONE SEEN

## 2015-08-13 LAB — URINALYSIS, ROUTINE W REFLEX MICROSCOPIC
Bilirubin Urine: NEGATIVE
GLUCOSE, UA: NEGATIVE mg/dL
HGB URINE DIPSTICK: NEGATIVE
Ketones, ur: NEGATIVE mg/dL
Nitrite: NEGATIVE
Protein, ur: NEGATIVE mg/dL
SPECIFIC GRAVITY, URINE: 1.029 (ref 1.005–1.030)
Urobilinogen, UA: 1 mg/dL (ref 0.0–1.0)
pH: 6 (ref 5.0–8.0)

## 2015-08-13 LAB — URINE MICROSCOPIC-ADD ON

## 2015-08-13 LAB — PREGNANCY, URINE: Preg Test, Ur: NEGATIVE

## 2015-08-13 MED ORDER — LIDOCAINE HCL (PF) 1 % IJ SOLN
INTRAMUSCULAR | Status: AC
  Administered 2015-08-13: 1.2 mL
  Filled 2015-08-13: qty 5

## 2015-08-13 MED ORDER — CEFTRIAXONE SODIUM 250 MG IJ SOLR
250.0000 mg | Freq: Once | INTRAMUSCULAR | Status: AC
  Administered 2015-08-13: 250 mg via INTRAMUSCULAR
  Filled 2015-08-13: qty 250

## 2015-08-13 MED ORDER — AZITHROMYCIN 250 MG PO TABS
1000.0000 mg | ORAL_TABLET | Freq: Once | ORAL | Status: AC
  Administered 2015-08-13: 1000 mg via ORAL
  Filled 2015-08-13: qty 4

## 2015-08-13 NOTE — ED Provider Notes (Signed)
CSN: [id number]     Arrival date & time 08/13/15  2144 History  By signing my name below, I, Helane Gunther, attest that this documentation has been prepared under the direction and in the presence of Malvin Johns, MD. Electronically Signed: Helane Gunther, ED Scribe. 08/13/2015. 10:04 PM.    Chief Complaint  Patient presents with  . Dysuria   The history is provided by the patient. No language interpreter was used.   HPI Comments: Cindy Le is a 34 y.o. female who presents to the Emergency Department complaining of dysuria onset 3 days ago. She reports associated mild, aching, mid-abdominal pain. She notes a PMHx of UTI and states this feels similar. Pt denies vaginal discharge, back pain, and fever.  Past Medical History  Diagnosis Date  . Thyroid disease   . Obesity    History reviewed. No pertinent past surgical history. No family history on file. Social History  Substance Use Topics  . Smoking status: Never Smoker   . Smokeless tobacco: None  . Alcohol Use: Yes   OB History    No data available     Review of Systems  Constitutional: Negative for fever, chills, diaphoresis and fatigue.  HENT: Negative for congestion, rhinorrhea and sneezing.   Eyes: Negative.   Respiratory: Negative for cough, chest tightness and shortness of breath.   Cardiovascular: Negative for chest pain and leg swelling.  Gastrointestinal: Positive for abdominal pain. Negative for nausea, vomiting, diarrhea and blood in stool.  Genitourinary: Positive for dysuria. Negative for frequency, hematuria, flank pain, vaginal discharge and difficulty urinating.  Musculoskeletal: Negative for back pain and arthralgias.  Skin: Negative for rash.  Neurological: Negative for dizziness, speech difficulty, weakness, numbness and headaches.    Allergies  Review of patient's allergies indicates no known allergies.  Home Medications   Prior to Admission medications   Medication Sig Start Date End Date  Taking? Authorizing Provider  levothyroxine (SYNTHROID, LEVOTHROID) 150 MCG tablet Take 1 tablet (150 mcg total) by mouth daily. 11/30/12   Carlisle Cater, PA-C   BP 151/88 mmHg  Pulse 83  Temp(Src) 98.4 F (36.9 C) (Oral)  Resp 18  Ht 5\' 4"  (1.626 m)  Wt 205 lb (92.987 kg)  BMI 35.17 kg/m2  SpO2 100%  LMP 07/30/2015 Physical Exam  Constitutional: She is oriented to person, place, and time. She appears well-developed and well-nourished.  HENT:  Head: Normocephalic and atraumatic.  Eyes: Pupils are equal, round, and reactive to light.  Neck: Normal range of motion. Neck supple.  Cardiovascular: Normal rate, regular rhythm and normal heart sounds.   Pulmonary/Chest: Effort normal and breath sounds normal. No respiratory distress. She has no wheezes. She has no rales. She exhibits no tenderness.  Abdominal: Soft. Bowel sounds are normal. There is no tenderness. There is no rebound and no guarding.  No CVA tenderness  Genitourinary: Vaginal discharge found.  Large amount of thin white discharge. There is no cervical motion tenderness, no adnexal tenderness  Musculoskeletal: Normal range of motion. She exhibits no edema.  Lymphadenopathy:    She has no cervical adenopathy.  Neurological: She is alert and oriented to person, place, and time.  Skin: Skin is warm and dry. No rash noted.  Psychiatric: She has a normal mood and affect.    ED Course  Procedures  DIAGNOSTIC STUDIES: Oxygen Saturation is 100% on RA, normal by my interpretation.    COORDINATION OF CARE: 10:02 PM - Discussed plans to wait on urinalysis. Pt advised of plan  for treatment and pt agrees.  Labs Review Labs Reviewed  WET PREP, GENITAL - Abnormal; Notable for the following:    WBC, Wet Prep HPF POC MANY (*)    All other components within normal limits  URINALYSIS, ROUTINE W REFLEX MICROSCOPIC (NOT AT [facility]) - Abnormal; Notable for the following:    APPearance CLOUDY (*)    Leukocytes, UA SMALL (*)    All  other components within normal limits  URINE MICROSCOPIC-ADD ON - Abnormal; Notable for the following:    Squamous Epithelial / LPF MANY (*)    Bacteria, UA MANY (*)    All other components within normal limits  PREGNANCY, URINE  HIV ANTIBODY (ROUTINE TESTING)  GC/CHLAMYDIA PROBE AMP ([employer]) NOT AT [facility]    Imaging Review No results found. I have personally reviewed and evaluated these lab results as part of my medical decision-making.   EKG Interpretation None      MDM   Final diagnoses:  Cervicitis    Patient's urine does not appear to be consistent with the UTI. She did have a large amount of vaginal discharge which I feel is likely the culprit of her symptoms. Her wet prep is negative. She was treated in the ED with Rocephin and Zithromax. STD panel was sent. Return precautions were given. She was advised that she needs to have her blood pressure rechecked by PCP.  I personally performed the services described in this documentation, which was scribed in my presence.  The recorded information has been reviewed and considered.   Malvin Johns, MD 08/13/15 [phone]

## 2015-08-13 NOTE — ED Notes (Signed)
C/o dysuria x 3 days  

## 2015-08-13 NOTE — ED Notes (Signed)
C/o increased urinary freq, burning w urination,  X 3 days  Denies vag dc

## 2015-08-13 NOTE — ED Notes (Signed)
MD at bedside. 

## 2015-08-13 NOTE — Discharge Instructions (Signed)
Cervicitis °Cervicitis is a soreness and swelling (inflammation) of the cervix. Your cervix is located at the bottom of your uterus. It opens up to the vagina. °CAUSES  °· Sexually transmitted infections (STIs).   °· Allergic reaction.   °· Medicines or birth control devices that are put in the vagina.   °· Injury to the cervix.   °· Bacterial infections.   °RISK FACTORS °You are at greater risk if you: °· Have unprotected sexual intercourse. °· Have sexual intercourse with many partners. °· Began sexual intercourse at an early age. °· Have a history of STIs. °SYMPTOMS  °There may be no symptoms. If symptoms occur, they may include:  °· Gray, white, yellow, or bad-smelling vaginal discharge.   °· Pain or itching of the area outside the vagina.   °· Painful sexual intercourse.   °· Lower abdominal or lower back pain, especially during intercourse.   °· Frequent urination.   °· Abnormal vaginal bleeding between periods, after sexual intercourse, or after menopause.   °· Pressure or a heavy feeling in the pelvis.   °DIAGNOSIS  °Diagnosis is made after a pelvic exam. Other tests may include:  °· Examination of any discharge under a microscope (wet prep).   °· A Pap test.   °TREATMENT  °Treatment will depend on the cause of cervicitis. If it is caused by an STI, both you and your partner will need to be treated. Antibiotic medicines will be given.  °HOME CARE INSTRUCTIONS  °· Do not have sexual intercourse until your health care provider says it is okay.   °· Do not have sexual intercourse until your partner has been treated, if your cervicitis is caused by an STI.   °· Take your antibiotics as directed. Finish them even if you start to feel better.   °SEEK MEDICAL CARE IF: °· Your symptoms come back.   °· You have a fever.   °MAKE SURE YOU:  °· Understand these instructions. °· Will watch your condition. °· Will get help right away if you are not doing well or get worse. °Document Released: 10/30/2005 Document Revised:  11/04/2013 Document Reviewed: 04/23/2013 °ExitCare® Patient Information ©2015 [employer]. This information is not intended to replace advice given to you by your health care provider. Make sure you discuss any questions you have with your health care provider. ° °

## 2015-08-15 LAB — HIV ANTIBODY (ROUTINE TESTING W REFLEX): HIV SCREEN 4TH GENERATION: NONREACTIVE

## 2015-08-16 LAB — GC/CHLAMYDIA PROBE AMP (~~LOC~~) NOT AT ARMC
CHLAMYDIA, DNA PROBE: NEGATIVE
NEISSERIA GONORRHEA: NEGATIVE

## 2015-12-03 ENCOUNTER — Emergency Department (HOSPITAL_BASED_OUTPATIENT_CLINIC_OR_DEPARTMENT_OTHER)
Admission: EM | Admit: 2015-12-03 | Discharge: 2015-12-03 | Disposition: A | Discharge status: Home / Self Care | Payer: No Typology Code available for payment source | Attending: Emergency Medicine | Admitting: Emergency Medicine

## 2015-12-03 ENCOUNTER — Encounter (HOSPITAL_BASED_OUTPATIENT_CLINIC_OR_DEPARTMENT_OTHER): Payer: Self-pay | Admitting: Emergency Medicine

## 2015-12-03 DIAGNOSIS — Z3202 Encounter for pregnancy test, result negative: Secondary | ICD-10-CM | POA: Insufficient documentation

## 2015-12-03 DIAGNOSIS — N898 Other specified noninflammatory disorders of vagina: Secondary | ICD-10-CM | POA: Insufficient documentation

## 2015-12-03 LAB — URINE MICROSCOPIC-ADD ON

## 2015-12-03 LAB — URINALYSIS, ROUTINE W REFLEX MICROSCOPIC
BILIRUBIN URINE: NEGATIVE
Glucose, UA: NEGATIVE mg/dL
KETONES UR: NEGATIVE mg/dL
NITRITE: NEGATIVE
PH: 6 (ref 5.0–8.0)
PROTEIN: NEGATIVE mg/dL
Specific Gravity, Urine: 1.026 (ref 1.005–1.030)

## 2015-12-03 LAB — PREGNANCY, URINE: PREG TEST UR: NEGATIVE

## 2015-12-03 NOTE — ED Notes (Signed)
Patient states that she has had a funny discharge to her vaginal area prior to her last period. It went away when she had her period and that stopped 2 days ago and she now has the same burning sensation and discharge

## 2015-12-03 NOTE — ED Notes (Signed)
Pt decided to leave ED stating "I'll just come back tomorrow."  Apologized for her wait thus far and encouraged her to return sooner with any worsening symptoms or should she change her mind about going home.

## 2017-05-13 ENCOUNTER — Emergency Department (HOSPITAL_BASED_OUTPATIENT_CLINIC_OR_DEPARTMENT_OTHER)
Admission: EM | Admit: 2017-05-13 | Discharge: 2017-05-13 | Disposition: A | Discharge status: Home / Self Care | Payer: 59 | Attending: Emergency Medicine | Admitting: Emergency Medicine

## 2017-05-13 ENCOUNTER — Encounter (HOSPITAL_BASED_OUTPATIENT_CLINIC_OR_DEPARTMENT_OTHER): Payer: Self-pay | Admitting: *Deleted

## 2017-05-13 ENCOUNTER — Emergency Department (HOSPITAL_BASED_OUTPATIENT_CLINIC_OR_DEPARTMENT_OTHER): Payer: 59

## 2017-05-13 DIAGNOSIS — R21 Rash and other nonspecific skin eruption: Secondary | ICD-10-CM | POA: Diagnosis present

## 2017-05-13 DIAGNOSIS — M79602 Pain in left arm: Secondary | ICD-10-CM

## 2017-05-13 DIAGNOSIS — L03114 Cellulitis of left upper limb: Secondary | ICD-10-CM

## 2017-05-13 MED ORDER — CEPHALEXIN 500 MG PO CAPS: 500.0000 mg | ORAL_CAPSULE | Freq: Four times a day (QID) | ORAL | 0 refills | Status: AC

## 2017-05-13 NOTE — ED Triage Notes (Signed)
Patient states she has had left arm pain for several weeks.  No known injury.  Describes the pain as shooting and aching. CMS intact.

## 2017-05-13 NOTE — Discharge Instructions (Signed)
Please take your antibiotics to treat your skin infection. Please follow-up with a primary care physician for further management. If any symptoms change or worsen, please return to the nearest emergency department.

## 2017-05-13 NOTE — ED Provider Notes (Signed)
[facility] Provider Note   CSN: [id number] Arrival date & time: 05/13/17  3893     History   Chief Complaint Chief Complaint  Patient presents with  . Arm Pain    Left    HPI Cindy Le is a 36 y.o. female.  The history is provided by the patient and medical records.  Rash   This is a new problem. The current episode started more than 1 week ago. The problem has been gradually worsening. The problem is associated with nothing. There has been no fever. The rash is present on the left arm. The pain is at a severity of 5/10. The pain is moderate. The pain has been constant since onset. Associated symptoms include pain. Pertinent negatives include no blisters. She has tried nothing for the symptoms. The treatment provided no relief.    Past Medical History:  Diagnosis Date  . Obesity   . Thyroid disease     There are no active problems to display for this patient.   History reviewed. No pertinent surgical history.  OB History    No data available       Home Medications    Prior to Admission medications   Medication Sig Start Date End Date Taking? Authorizing Provider  PHENTERMINE HCL PO Take by mouth.   Yes [provider]  levothyroxine (SYNTHROID, LEVOTHROID) 150 MCG tablet Take 1 tablet (150 mcg total) by mouth daily. 11/30/12   Carlisle Cater, PA-C    Family History No family history on file.  Social History Social History  Substance Use Topics  . Smoking status: Never Smoker  . Smokeless tobacco: Never Used  . Alcohol use Yes     Allergies   Patient has no known allergies.   Review of Systems Review of Systems  Constitutional: Negative for chills, diaphoresis, fatigue and fever.  HENT: Negative for congestion and rhinorrhea.   Eyes: Negative for visual disturbance.  Respiratory: Negative for chest tightness, shortness of breath and wheezing.   Cardiovascular: Negative for chest pain and palpitations.  Gastrointestinal:  Negative for constipation, diarrhea, nausea and vomiting.  Genitourinary: Negative for dysuria, flank pain and frequency.  Musculoskeletal: Negative for back pain, neck pain and neck stiffness.  Skin: Positive for rash. Negative for wound.  Neurological: Positive for headaches. Negative for light-headedness.  Psychiatric/Behavioral: Negative for agitation.  All other systems reviewed and are negative.    Physical Exam Updated Vital Signs BP (!) 155/108 (BP Location: Right Arm)   Pulse 86   Temp 98.2 F (36.8 C) (Oral)   Resp 20   Ht 5\' 5"  (1.651 m)   Wt 99.8 kg (220 lb)   LMP 05/12/2017 (Exact Date)   SpO2 100%   BMI 36.61 kg/m   Physical Exam  Constitutional: She appears well-developed and well-nourished. No distress.  HENT:  Head: Normocephalic and atraumatic.  Mouth/Throat: Oropharynx is clear and moist. No oropharyngeal exudate.  Eyes: Conjunctivae are normal.  Neck: Neck supple.  Cardiovascular: Normal rate and regular rhythm.   No murmur heard. Pulmonary/Chest: Effort normal and breath sounds normal. No respiratory distress. She has no rales. She exhibits no tenderness.  Abdominal: Soft. There is no tenderness.  Musculoskeletal: She exhibits tenderness. She exhibits no edema.       Left upper arm: She exhibits tenderness. She exhibits no swelling, no edema, no deformity and no laceration.       Arms: Neurological: She is alert.  Skin: Skin is warm and dry. Capillary refill  takes less than 2 seconds. She is not diaphoretic. There is erythema.  Psychiatric: She has a normal mood and affect.  Nursing note and vitals reviewed.    ED Treatments / Results  Labs (all labs ordered are listed, but only abnormal results are displayed) Labs Reviewed - No data to display  EKG  EKG Interpretation None       Radiology Dg Humerus Left  Result Date: 05/13/2017 CLINICAL DATA:  LEFT arm pain for several weeks, no known injury, reddened area suddenly appeared at distal  humeral anterolateral surface EXAM: LEFT HUMERUS - 2+ VIEW COMPARISON:  None FINDINGS: Osseous mineralization normal. Joint spaces preserved. No fracture, dislocation, or bone destruction. IMPRESSION: Normal exam. Electronically Signed   By: Lavonia Dana M.D.   On: 05/13/2017 08:06    Procedures Procedures (including critical care time)  Medications Ordered in ED Medications - No data to display   Initial Impression / Assessment and Plan / ED Course  I have reviewed the triage vital signs and the nursing notes.  Pertinent labs & imaging results that were available during my care of the patient were reviewed by me and considered in my medical decision making (see chart for details).     Cindy Le is a 36 y.o. female  with a past medical history significant for thyroid disease who presents with left arm pain and redness. Patient says that over the last 2 weeks, she has had continued generally worsening symptoms. She is right-handed. She says that she has developed pain and rash on the left humerus area towards her left forearm. Patient denies any traumatic injuries this location. She says that she is developed some scratches on the left forearm. She is unsure how she cut these. She says the pain is a moderate pain. She reports some mild headaches but denies any fevers, chills, chest pain, shortness breath, nausea, vomiting, conservation, diarrhea, dysuria. She denies difficulty with grip strength or movement of her elbow.   History and exam are seen above.  On exam, patient has an area of erythema and tenderness on the left humerus area. Does not look vesicular. Looks more cellulitic. Patient has normal range of motion in the shoulder, elbow, and wrist. Normal pulses. Normal capillary refill. Normal sensation in the upper extremities. No palpable lymph nodes in the axilla appreciated. No neck tenderness. Lungs clear.  Based on appearance, suspect cellulitis. Also considered is shingles  however, the rash does not look vesicular and definitively shingle like. Patient on x-ray to look for underlying abnormality.  X-rays unremarkable, suspect patient will be safe for discharge with antibiotics for cellulitis and PCP follow-up.  X-ray results in above. No evidence of abnormality on x-ray. Based on exam and history, do not feel patient has upper extremity DVT or other deep infection. No evidence of fractures.   Patient will be discharged with prescription of antibiotics to treat cellulitis. Patient given symptomatic management instructions including ice and rest. Patient will follow up with her PCP for further management as well as strict return precautions for any new or worsened symptoms.  Patient discharged in good condition.   Final Clinical Impressions(s) / ED Diagnoses   Final diagnoses:  Left arm pain  Cellulitis of left upper extremity    New Prescriptions Discharge Medication List as of 05/13/2017  9:50 AM    START taking these medications   Details  cephALEXin (KEFLEX) 500 MG capsule Take 1 capsule (500 mg total) by mouth 4 (four) times daily., Starting Sun  05/13/2017, Until Sun 05/20/2017, Print        Clinical Impression: 1. Left arm pain   2. Cellulitis of left upper extremity     Disposition: Discharge  Condition: Good  I have discussed the results, Dx and Tx plan with the pt(& family if present). He/she/they expressed understanding and agree(s) with the plan. Discharge instructions discussed at great length. Strict return precautions discussed and pt &/or family have verbalized understanding of the instructions. No further questions at time of discharge.    Discharge Medication List as of 05/13/2017  9:50 AM    START taking these medications   Details  cephALEXin (KEFLEX) 500 MG capsule Take 1 capsule (500 mg total) by mouth 4 (four) times daily., Starting Sun 05/13/2017, Until Sun 05/20/2017, Print        Follow Up: [place] [address] [place] [id number] [phone] Schedule an appointment as soon as possible for a visit    [place] [address] [id number] [facility] [address] [place] [phone]  If symptoms worsen     Tegeler, Gwenyth Allegra, MD 05/13/17 1754

## 2017-05-26 ENCOUNTER — Encounter (HOSPITAL_BASED_OUTPATIENT_CLINIC_OR_DEPARTMENT_OTHER): Payer: Self-pay | Admitting: Emergency Medicine

## 2017-05-26 ENCOUNTER — Emergency Department (HOSPITAL_BASED_OUTPATIENT_CLINIC_OR_DEPARTMENT_OTHER)
Admission: EM | Admit: 2017-05-26 | Discharge: 2017-05-27 | Disposition: A | Discharge status: Home / Self Care | Payer: 59 | Attending: Emergency Medicine | Admitting: Emergency Medicine

## 2017-05-26 DIAGNOSIS — M79622 Pain in left upper arm: Secondary | ICD-10-CM | POA: Diagnosis present

## 2017-05-26 DIAGNOSIS — Z79899 Other long term (current) drug therapy: Secondary | ICD-10-CM | POA: Diagnosis not present

## 2017-05-26 DIAGNOSIS — M659 Synovitis and tenosynovitis, unspecified: Secondary | ICD-10-CM | POA: Diagnosis not present

## 2017-05-26 DIAGNOSIS — M779 Enthesopathy, unspecified: Secondary | ICD-10-CM

## 2017-05-26 NOTE — ED Triage Notes (Signed)
Pt presents with left upper arm pain that started over a week ago. Pt sts she was here last week and was diagnosed with cellulitis and still has pain and was only given 7 days of antibiotics.

## 2017-05-27 MED ORDER — MELOXICAM 15 MG PO TABS: 15.0000 mg | ORAL_TABLET | Freq: Every day | ORAL | 0 refills | Status: DC

## 2017-05-27 NOTE — ED Provider Notes (Signed)
[facility] Provider Note   CSN: [id number] Arrival date & time: 05/26/17  2337     History   Chief Complaint Chief Complaint  Patient presents with  . Arm Pain    HPI Cindy Le is a 36 y.o. female.  Patient presents to the ER for evaluation of continued pain of her left arm. Patient was seen 2 weeks ago in this ER. At that time she had some erythema of the left upper arm, treated with Keflex for possible cellulitis. Patient reports that the redness has improved but she continues to have deep pain in the upper arm. Pain worsens with movement. She denies any injury. No numbness, tingling or weakness of the extremity.      Past Medical History:  Diagnosis Date  . Obesity   . Thyroid disease     There are no active problems to display for this patient.   History reviewed. No pertinent surgical history.  OB History    No data available       Home Medications    Prior to Admission medications   Medication Sig Start Date End Date Taking? Authorizing Provider  levothyroxine (SYNTHROID, LEVOTHROID) 150 MCG tablet Take 1 tablet (150 mcg total) by mouth daily. 11/30/12   Carlisle Cater, PA-C  meloxicam (MOBIC) 15 MG tablet Take 1 tablet (15 mg total) by mouth daily. 05/27/17   Orpah Greek, MD  PHENTERMINE HCL PO Take by mouth.    [provider]    Family History No family history on file.  Social History Social History  Substance Use Topics  . Smoking status: Never Smoker  . Smokeless tobacco: Never Used  . Alcohol use Yes     Allergies   Patient has no known allergies.   Review of Systems Review of Systems  Musculoskeletal: Positive for myalgias.  Skin: Negative for color change.  All other systems reviewed and are negative.    Physical Exam Updated Vital Signs BP (!) 160/115 (BP Location: Left Arm)   Pulse 87   Temp 98.6 F (37 C) (Oral)   Resp 20   LMP 05/12/2017 (Exact Date)   SpO2 100%   Physical Exam   Constitutional: She is oriented to person, place, and time. She appears well-developed and well-nourished. No distress.  HENT:  Head: Normocephalic and atraumatic.  Right Ear: Hearing normal.  Left Ear: Hearing normal.  Nose: Nose normal.  Mouth/Throat: Oropharynx is clear and moist and mucous membranes are normal.  Eyes: Pupils are equal, round, and reactive to light. Conjunctivae and EOM are normal.  Neck: Normal range of motion. Neck supple.  Cardiovascular: Regular rhythm, S1 normal and S2 normal.  Exam reveals no gallop and no friction rub.   No murmur heard. Pulmonary/Chest: Effort normal and breath sounds normal. No respiratory distress. She exhibits no tenderness.  Abdominal: Soft. Normal appearance and bowel sounds are normal. There is no hepatosplenomegaly. There is no tenderness. There is no rebound, no guarding, no tenderness at McBurney's point and negative Murphy's sign. No hernia.  Musculoskeletal: Normal range of motion.       Left shoulder: She exhibits tenderness (Proximal medial biceps tendon). She exhibits normal range of motion.       Left elbow: She exhibits normal range of motion, no swelling, no effusion and no deformity. Tenderness (Distal triceps and tendon) found.  Normal range of motion at shoulder and elbow but does have pain with flexion and abduction at the shoulder as well as extension  at the elbow  Neurological: She is alert and oriented to person, place, and time. She has normal strength. No cranial nerve deficit or sensory deficit. Coordination normal. GCS eye subscore is 4. GCS verbal subscore is 5. GCS motor subscore is 6.  Skin: Skin is warm, dry and intact. No rash noted. No cyanosis.  Psychiatric: She has a normal mood and affect. Her speech is normal and behavior is normal. Thought content normal.  Nursing note and vitals reviewed.    ED Treatments / Results  Labs (all labs ordered are listed, but only abnormal results are displayed) Labs Reviewed  - No data to display  EKG  EKG Interpretation None       Radiology No results found.  Procedures Procedures (including critical care time)  Medications Ordered in ED Medications - No data to display   Initial Impression / Assessment and Plan / ED Course  I have reviewed the triage vital signs and the nursing notes.  Pertinent labs & imaging results that were available during my care of the patient were reviewed by me and considered in my medical decision making (see chart for details).     Patient treated 2 weeks ago for possible cellulitis secondary to erythema of the left upper arm with generalized tenderness in the region. Erythema appears to have resolved. Patient concerned about persistent infection because she is still having pain. There is no sign of any infection currently, but she does have tenderness over the proximal biceps and distal triceps region, likely tendinitis. Both of these areas significantly worsen with movement against resistance. She does have normal range of motion of both joints. Patient reassured, no sinus continued infection and no need for any further antibiotics. She with rest, warm compresses, anti-inflammatory medication.  Final Clinical Impressions(s) / ED Diagnoses   Final diagnoses:  Tendinitis    New Prescriptions New Prescriptions   MELOXICAM (MOBIC) 15 MG TABLET    Take 1 tablet (15 mg total) by mouth daily.     Orpah Greek, MD 05/27/17 [phone]

## 2019-12-01 ENCOUNTER — Other Ambulatory Visit (HOSPITAL_COMMUNITY): Payer: Self-pay | Admitting: Obstetrics & Gynecology

## 2019-12-01 ENCOUNTER — Other Ambulatory Visit: Payer: Self-pay | Admitting: Obstetrics & Gynecology

## 2019-12-01 DIAGNOSIS — D219 Benign neoplasm of connective and other soft tissue, unspecified: Secondary | ICD-10-CM

## 2019-12-05 ENCOUNTER — Ambulatory Visit (HOSPITAL_COMMUNITY): Payer: 59

## 2019-12-12 ENCOUNTER — Ambulatory Visit (HOSPITAL_COMMUNITY)
Admission: RE | Admit: 2019-12-12 | Discharge: 2019-12-12 | Disposition: A | Discharge status: Home / Self Care | Payer: 59 | Source: Ambulatory Visit | Attending: Obstetrics & Gynecology | Admitting: Obstetrics & Gynecology

## 2019-12-12 ENCOUNTER — Other Ambulatory Visit: Payer: Self-pay

## 2019-12-12 DIAGNOSIS — D219 Benign neoplasm of connective and other soft tissue, unspecified: Secondary | ICD-10-CM | POA: Diagnosis present

## 2021-04-14 ENCOUNTER — Emergency Department (HOSPITAL_BASED_OUTPATIENT_CLINIC_OR_DEPARTMENT_OTHER): Payer: 59

## 2021-04-14 ENCOUNTER — Other Ambulatory Visit: Payer: Self-pay

## 2021-04-14 ENCOUNTER — Encounter (HOSPITAL_BASED_OUTPATIENT_CLINIC_OR_DEPARTMENT_OTHER): Payer: Self-pay | Admitting: Emergency Medicine

## 2021-04-14 DIAGNOSIS — R1310 Dysphagia, unspecified: Secondary | ICD-10-CM | POA: Diagnosis present

## 2021-04-14 DIAGNOSIS — Z79890 Hormone replacement therapy (postmenopausal): Secondary | ICD-10-CM | POA: Diagnosis not present

## 2021-04-14 DIAGNOSIS — Z5321 Procedure and treatment not carried out due to patient leaving prior to being seen by health care provider: Secondary | ICD-10-CM | POA: Insufficient documentation

## 2021-04-14 NOTE — ED Triage Notes (Signed)
Pt states she was seen at urgent care on Friday because she felt like her throat was swelling  Pt states everyday since she has felt the same  Pt states it feels hard to swallow and states she feels like she is having a hard time getting a breath  Pt is in no distress in triage

## 2021-04-15 ENCOUNTER — Emergency Department (HOSPITAL_BASED_OUTPATIENT_CLINIC_OR_DEPARTMENT_OTHER)
Admission: EM | Admit: 2021-04-15 | Discharge: 2021-04-15 | Disposition: A | Discharge status: Home / Self Care | Payer: 59 | Attending: Emergency Medicine | Admitting: Emergency Medicine

## 2021-04-15 ENCOUNTER — Other Ambulatory Visit: Payer: Self-pay

## 2021-04-15 ENCOUNTER — Emergency Department (HOSPITAL_BASED_OUTPATIENT_CLINIC_OR_DEPARTMENT_OTHER): Payer: 59

## 2021-04-15 ENCOUNTER — Encounter (HOSPITAL_BASED_OUTPATIENT_CLINIC_OR_DEPARTMENT_OTHER): Payer: Self-pay

## 2021-04-15 ENCOUNTER — Emergency Department (HOSPITAL_BASED_OUTPATIENT_CLINIC_OR_DEPARTMENT_OTHER)
Admission: EM | Admit: 2021-04-15 | Discharge: 2021-04-15 | Disposition: A | Discharge status: Home / Self Care | Payer: 59 | Source: Home / Self Care | Attending: Emergency Medicine | Admitting: Emergency Medicine

## 2021-04-15 DIAGNOSIS — R1312 Dysphagia, oropharyngeal phase: Secondary | ICD-10-CM | POA: Insufficient documentation

## 2021-04-15 NOTE — ED Triage Notes (Addendum)
Pt c/o "feel like a lump in my throat-worse when I try to swallow"-denies as feeling like a sore throat-states she had thyroidectomy in 2006-NAD-steady gait

## 2021-04-15 NOTE — ED Provider Notes (Signed)
[facility] EMERGENCY DEPARTMENT Provider Note   CSN: [id number] Arrival date & time: 04/15/21  1121     History Chief Complaint  Patient presents with  . Mass    Cindy Le is a 40 y.o. female.  HPI      40 year old female with a history of thyroidectomy in 2006, presents with concern for throat swelling and difficulty swallowing.  1 week ago woke up with feeling of lump in throat.  Chronic hoarse voice since thyroidectomy.  Feels like it is difficult to swallow like there is something there.  No drooling. Feels short of breath at times but think it is when trying to swallow or eat.  Feels sore now, thinks it is because trying to forcefully swallow.  Ate donut last night and able to swallow it but feel like was sticking in back of throat. Has not eaten today because of that.  Came to ED last night but the wait was long and worried about covid exposures and left.   No fevers, no nausea or vomiting.  No rash, diarrhea, lightheadedness.  Has not felt this way before.  Was seen last Friday at urgent care and given a shot of steroids,'s prescription for antacid medication, but reports that symptoms have been progressing and worsening despite this.  Past Medical History:  Diagnosis Date  . Obesity   . Thyroid disease     There are no problems to display for this patient.   History reviewed. No pertinent surgical history.   OB History   No obstetric history on file.     Family History  Problem Relation Age of Onset  . Stroke Other     Social History   Tobacco Use  . Smoking status: Never Smoker  . Smokeless tobacco: Never Used  Vaping Use  . Vaping Use: Never used  Substance Use Topics  . Alcohol use: Yes    Comment: twice a week  . Drug use: No    Home Medications Prior to Admission medications   Medication Sig Start Date End Date Taking? Authorizing Provider  levothyroxine (SYNTHROID, LEVOTHROID) 150 MCG tablet Take 1 tablet (150 mcg total) by  mouth daily. 11/30/12   Carlisle Cater, PA-C  meloxicam (MOBIC) 15 MG tablet Take 1 tablet (15 mg total) by mouth daily. 05/27/17   Orpah Greek, MD  PHENTERMINE HCL PO Take by mouth.    [provider]    Allergies    Patient has no known allergies.  Review of Systems   Review of Systems  Constitutional: Negative for fever.  HENT: Positive for sore throat and trouble swallowing. Negative for rhinorrhea. Voice change: chronic.   Eyes: Negative for visual disturbance.  Respiratory: Positive for shortness of breath. Negative for cough.   Cardiovascular: Negative for chest pain.  Gastrointestinal: Negative for abdominal pain, nausea and vomiting.  Genitourinary: Negative for difficulty urinating.  Musculoskeletal: Negative for back pain and neck pain.  Skin: Negative for rash.  Neurological: Negative for syncope and headaches.    Physical Exam Updated Vital Signs BP (!) 161/107 (BP Location: Left Arm)   Pulse 84   Temp 98.3 F (36.8 C) (Oral)   Resp 16   LMP 04/09/2021 (Approximate)   SpO2 100%   Physical Exam Vitals and nursing note reviewed.  Constitutional:      General: She is not in acute distress.    Appearance: She is well-developed. She is not diaphoretic.  HENT:     Head: Normocephalic and  atraumatic.     Mouth/Throat:     Mouth: Mucous membranes are moist.     Pharynx: No oropharyngeal exudate or posterior oropharyngeal erythema.  Eyes:     Conjunctiva/sclera: Conjunctivae normal.  Cardiovascular:     Rate and Rhythm: Normal rate and regular rhythm.     Heart sounds: Normal heart sounds. No murmur heard. No friction rub. No gallop.   Pulmonary:     Effort: Pulmonary effort is normal. No respiratory distress.     Breath sounds: Normal breath sounds. No wheezing or rales.  Abdominal:     General: There is no distension.     Palpations: Abdomen is soft.     Tenderness: There is no abdominal tenderness. There is no guarding.  Musculoskeletal:         General: No tenderness.     Cervical back: Normal range of motion. No edema, erythema or rigidity. Normal range of motion.  Skin:    General: Skin is warm and dry.     Findings: No erythema or rash.  Neurological:     Mental Status: She is alert and oriented to person, place, and time.     ED Results / Procedures / Treatments   Labs (all labs ordered are listed, but only abnormal results are displayed) Labs Reviewed - No data to display  EKG None  Radiology CT Soft Tissue Neck Wo Contrast  Result Date: 04/15/2021 CLINICAL DATA:  Neck abscess, deep tissue; technologist note states lump in throat, worse with swallowing EXAM: CT NECK WITHOUT CONTRAST TECHNIQUE: Multidetector CT imaging of the neck was performed following the standard protocol without intravenous contrast. COMPARISON:  None. FINDINGS: Pharynx and larynx: Unremarkable.  No mass or swelling. Salivary glands: Parotid and submandibular glands are unremarkable. Thyroid: Not well seen, noting reported history of thyroidectomy. Lymph nodes: No enlarged or abnormal density nodes identified. Vascular: No significant vascular abnormality on this non-contrast study. Limited intracranial: No acute abnormality. Visualized orbits: Unremarkable. Mastoids and visualized paranasal sinuses: Minor mucosal thickening. Mastoid air cells are clear. Skeleton: Degenerative changes of the cervical spine. Upper chest: Included upper lungs are clear. Other: None. IMPRESSION: No neck mass or adenopathy. Electronically Signed   By: Macy Mis M.D.   On: 04/15/2021 12:25    Procedures Procedures   Medications Ordered in ED Medications - No data to display  ED Course  I have reviewed the triage vital signs and the nursing notes.  Pertinent labs & imaging results that were available during my care of the patient were reviewed by me and considered in my medical decision making (see chart for details).    MDM Rules/Calculators/A&P                           40 year old female with a history of thyroidectomy in 2006, presents with concern for throat swelling and difficulty swallowing.  Given progression of symptoms over the last week, and sensation of dyspnea with some pain, ordered CT for evaluation for signs of epiglottitis, retropharyngeal abscess, or other significant airway narrowing.  CT soft tissue neck does not show any signs of acute abnormalities.  Do not feel that history and exam are consistent with anaphylaxis.  History and exam are also not consistent with esophageal food impaction.  Afebrile, no stridor, no drooling. Recommend continued antacid medication as prescribed by urgent care, and follow-up with ear nose and throat. Patient discharged in stable condition with understanding of reasons to return.  Final Clinical Impression(s) / ED Diagnoses Final diagnoses:  Oropharyngeal dysphagia    Rx / DC Orders ED Discharge Orders    None       Gareth Morgan, MD 04/15/21 1253

## 2021-11-21 ENCOUNTER — Other Ambulatory Visit: Payer: Self-pay | Admitting: Obstetrics & Gynecology

## 2021-11-21 DIAGNOSIS — R928 Other abnormal and inconclusive findings on diagnostic imaging of breast: Secondary | ICD-10-CM

## 2021-12-05 ENCOUNTER — Other Ambulatory Visit: Payer: Self-pay | Admitting: Obstetrics and Gynecology

## 2021-12-05 DIAGNOSIS — D259 Leiomyoma of uterus, unspecified: Secondary | ICD-10-CM

## 2021-12-15 ENCOUNTER — Ambulatory Visit
Admission: RE | Admit: 2021-12-15 | Discharge: 2021-12-15 | Disposition: A | Discharge status: Home / Self Care | Payer: 59 | Source: Ambulatory Visit | Attending: Obstetrics & Gynecology | Admitting: Obstetrics & Gynecology

## 2021-12-15 DIAGNOSIS — R928 Other abnormal and inconclusive findings on diagnostic imaging of breast: Secondary | ICD-10-CM

## 2021-12-21 ENCOUNTER — Ambulatory Visit
Admission: RE | Admit: 2021-12-21 | Discharge: 2021-12-21 | Disposition: A | Discharge status: Home / Self Care | Payer: 59 | Source: Ambulatory Visit | Attending: Obstetrics and Gynecology | Admitting: Obstetrics and Gynecology

## 2021-12-21 ENCOUNTER — Other Ambulatory Visit: Payer: Self-pay

## 2021-12-21 DIAGNOSIS — D259 Leiomyoma of uterus, unspecified: Secondary | ICD-10-CM

## 2021-12-21 MED ORDER — GADOBENATE DIMEGLUMINE 529 MG/ML IV SOLN
19.0000 mL | Freq: Once | INTRAVENOUS | Status: AC | PRN
  Administered 2021-12-21: 19 mL via INTRAVENOUS

## 2022-03-29 NOTE — Progress Notes (Signed)
Surgical Instructions ? ? ? Your procedure is scheduled on Thursday, May 25th, 2023. ? ? Report to [facility] Main Entrance "A" at 05:30 A.M., then check in with the Admitting office. ? Call this number if you have problems the morning of surgery: ? [phone] ? ? If you have any questions prior to your surgery date call [phone]: Open Monday-Friday 8am-4pm ? ? ? Remember: ? Do not eat after midnight the night before your surgery ? ?You may drink clear liquids until 04:30 the morning of your surgery.   ?Clear liquids allowed are: Water, Non-Citrus Juices (without pulp), Carbonated Beverages, Clear Tea, Black Coffee ONLY (NO MILK, CREAM OR POWDERED CREAMER of any kind), and Gatorade ?  ? Take these medicines the morning of surgery with A SIP OF WATER:  ? ?amLODipine (NORVASC)  ?levothyroxine (SYNTHROID) ? ?As of today, STOP taking any Aspirin (unless otherwise instructed by your surgeon) Aleve, Naproxen, Ibuprofen, Motrin, Advil, Goody's, BC's, all herbal medications, fish oil, and all vitamins. ? ? The day of surgery: ?         ?Do not wear jewelry or makeup ?Do not wear lotions, powders, perfumes, or deodorant. ?Do not shave 48 hours prior to surgery.   ?Do not bring valuables to the hospital. ?Do not wear nail polish, gel polish, artificial nails, or any other type of covering on natural nails (fingers and toes) ?If you have artificial nails or gel coating that need to be removed by a nail salon, please have this removed prior to surgery. Artificial nails or gel coating may interfere with anesthesia's ability to adequately monitor your vital signs. ? ?[facility] is not responsible for any belongings or valuables. .  ? ?Do NOT Smoke (Tobacco/Vaping)  24 hours prior to your procedure ? ?If you use a CPAP at night, you may bring your mask for your overnight stay. ?  ?Contacts, glasses, hearing aids, dentures or partials may not be worn into surgery, please bring cases for these belongings ?  ?For patients  admitted to the hospital, discharge time will be determined by your treatment team. ?  ?Patients discharged the day of surgery will not be allowed to drive home, and someone needs to stay with them for 24 hours. ? ? ?SURGICAL WAITING ROOM VISITATION ?Patients having surgery or a procedure in a hospital may have two support people. ?Children under the age of 59 must have an adult with them who is not the patient. ?They may stay in the waiting area during the procedure and may switch out with other visitors. If the patient needs to stay at the hospital during part of their recovery, the visitor guidelines for inpatient rooms apply. ? ?Please refer to the [place] website for the visitor guidelines for Inpatients (after your surgery is over and you are in a regular room).  ? ? ?Special instructions:   ? ?Oral Hygiene is also important to reduce your risk of infection.  Remember - BRUSH YOUR TEETH THE MORNING OF SURGERY WITH YOUR REGULAR TOOTHPASTE ? ? ?- Preparing For Surgery ? ?Before surgery, you can play an important role. Because skin is not sterile, your skin needs to be as free of germs as possible. You can reduce the number of germs on your skin by washing with CHG (chlorahexidine gluconate) Soap before surgery.  CHG is an antiseptic cleaner which kills germs and bonds with the skin to continue killing germs even after washing.   ? ? ?Please do not use if you  have an allergy to CHG or antibacterial soaps. If your skin becomes reddened/irritated stop using the CHG.  ?Do not shave (including legs and underarms) for at least 48 hours prior to first CHG shower. It is OK to shave your face. ? ?Please follow these instructions carefully. ?  ? ? Shower the NIGHT BEFORE SURGERY and the MORNING OF SURGERY with CHG Soap.  ? If you chose to wash your hair, wash your hair first as usual with your normal shampoo. After you shampoo, rinse your hair and body thoroughly to remove the shampoo.  Then [employer] and  genitals (private parts) with your normal soap and rinse thoroughly to remove soap. ? ?After that Use CHG Soap as you would any other liquid soap. You can apply CHG directly to the skin and wash gently with a scrungie or a clean washcloth.  ? ?Apply the CHG Soap to your body ONLY FROM THE NECK DOWN.  Do not use on open wounds or open sores. Avoid contact with your eyes, ears, mouth and genitals (private parts). Wash Face and genitals (private parts)  with your normal soap.  ? ?Wash thoroughly, paying special attention to the area where your surgery will be performed. ? ?Thoroughly rinse your body with warm water from the neck down. ? ?DO NOT shower/wash with your normal soap after using and rinsing off the CHG Soap. ? ?Pat yourself dry with a CLEAN TOWEL. ? ?Wear CLEAN PAJAMAS to bed the night before surgery ? ?Place CLEAN SHEETS on your bed the night before your surgery ? ?DO NOT SLEEP WITH PETS. ? ? ?Day of Surgery: ? ?Take a shower with CHG soap. ?Wear Clean/Comfortable clothing the morning of surgery ?Do not apply any deodorants/lotions.   ?Remember to brush your teeth WITH YOUR REGULAR TOOTHPASTE. ? ? ? ?If you received a COVID test during your pre-op visit, it is requested that you wear a mask when out in public, stay away from anyone that may not be feeling well, and notify your surgeon if you develop symptoms. If you have been in contact with anyone that has tested positive in the last 10 days, please notify your surgeon. ? ?  ?Please read over the following fact sheets that you were given.   ?

## 2022-03-30 ENCOUNTER — Encounter (HOSPITAL_COMMUNITY)
Admission: RE | Admit: 2022-03-30 | Discharge: 2022-03-30 | Disposition: A | Discharge status: Home / Self Care | Payer: 59 | Source: Ambulatory Visit | Attending: Obstetrics and Gynecology | Admitting: Obstetrics and Gynecology

## 2022-03-30 ENCOUNTER — Encounter (HOSPITAL_COMMUNITY): Payer: Self-pay

## 2022-03-30 ENCOUNTER — Other Ambulatory Visit: Payer: Self-pay

## 2022-03-30 DIAGNOSIS — Z01812 Encounter for preprocedural laboratory examination: Secondary | ICD-10-CM | POA: Insufficient documentation

## 2022-03-30 DIAGNOSIS — Z01818 Encounter for other preprocedural examination: Secondary | ICD-10-CM

## 2022-03-30 HISTORY — DX: Dermatitis, unspecified: L30.9

## 2022-03-30 HISTORY — DX: Essential (primary) hypertension: I10

## 2022-03-30 LAB — TYPE AND SCREEN
ABO/RH(D): B POS
Antibody Screen: NEGATIVE

## 2022-03-30 LAB — CBC WITH DIFFERENTIAL/PLATELET
Abs Immature Granulocytes: 0 10*3/uL (ref 0.00–0.07)
Basophils Absolute: 0 10*3/uL (ref 0.0–0.1)
Basophils Relative: 1 %
Eosinophils Absolute: 0.9 10*3/uL — ABNORMAL HIGH (ref 0.0–0.5)
Eosinophils Relative: 16 %
HCT: 36.9 % (ref 36.0–46.0)
Hemoglobin: 12 g/dL (ref 12.0–15.0)
Immature Granulocytes: 0 %
Lymphocytes Relative: 33 %
Lymphs Abs: 1.8 10*3/uL (ref 0.7–4.0)
MCH: 29.1 pg (ref 26.0–34.0)
MCHC: 32.5 g/dL (ref 30.0–36.0)
MCV: 89.6 fL (ref 80.0–100.0)
Monocytes Absolute: 0.5 10*3/uL (ref 0.1–1.0)
Monocytes Relative: 10 %
Neutro Abs: 2.2 10*3/uL (ref 1.7–7.7)
Neutrophils Relative %: 40 %
Platelets: 371 10*3/uL (ref 150–400)
RBC: 4.12 MIL/uL (ref 3.87–5.11)
RDW: 15.3 % (ref 11.5–15.5)
WBC: 5.4 10*3/uL (ref 4.0–10.5)
nRBC: 0 % (ref 0.0–0.2)

## 2022-03-30 NOTE — Progress Notes (Signed)
PCP - Daisy Lazar, PA Cardiologist - Denies  PPM/ICD - Denies  Chest x-ray - NI EKG - NI Stress Test - Denies ECHO - Denies Cardiac Cath - Denies  Sleep Study - Denies  DM - Denies  ERAS Protcol - Yes  Anesthesia review: NO   Patient denies shortness of breath, fever, cough and chest pain at PAT appointment   All instructions explained to the patient, with a verbal understanding of the material. Patient agrees to go over the instructions while at home for a better understanding. The opportunity to ask questions was provided.

## 2022-04-05 NOTE — H&P (Signed)
Cindy Le is an 41 y.o. female P0 with multifibroid uterus, abnormal uterine bleeding, and bulk symptoms. Failed management with OCPs.   12/21/21 MRI Uterus: Measures 21.0 x 11.3 by 15.6 cm (volume = 1940 cm^3). Two dominant fibroids are seen which are intramural in location. These are located in the right anterior fundus measuring 12.4 cm in maximum diameter, and the posterior lower uterine corpus measuring 7.5 cm in maximum diameter.   Additional small intramural fibroid is seen in the left posterior corpus measuring 2.1 cm. Small subserosal fibroids are seen in the right lateral upper uterine corpus measuring 3.1 cm, and the posterior lower uterine segment measuring 2.8 cm. A 2.8 cm submucosal fibroid is also seen in the right posterior upper uterine corpus.   Endometrial thickness measures 6 mm. The cervix is displaced anteriorly by the fibroid in the posterior lower uterine segment described above, but is otherwise normal in appearance.   -- Intracavitary fibroids: None.   -- Pedunculated fibroids: None.   -- Fibroid contrast enhancement: All fibroids show contrast enhancement, without significant degeneration/devascularization.   -- Right ovary: Appears normal. No mass identified.   -- Left ovary: Appears normal. No mass identified.    Menstrual History: Patient's last menstrual period was 03/18/2022 (exact date).    Past Medical History:  Diagnosis Date   Eczema    Hypertension    Obesity    Thyroid disease     No past surgical history on file.  Family History  Problem Relation Age of Onset   Stroke Other     Social History:  reports that she has never smoked. She has never used smokeless tobacco. She reports current alcohol use. She reports that she does not use drugs.  Allergies: No Known Allergies  No medications prior to admission.    Review of Systems  Constitutional:  Negative for fever.  HENT:  Negative for sore throat.   Eyes:  Negative  for visual disturbance.  Respiratory:  Negative for shortness of breath.   Cardiovascular:  Negative for chest pain.  Gastrointestinal:  Negative for vomiting.  Genitourinary:  Positive for pelvic pain and vaginal bleeding.  Musculoskeletal:  Negative for myalgias.  Skin:  Negative for rash.  Neurological:  Negative for headaches.  Psychiatric/Behavioral:  Negative for sleep disturbance.    Last menstrual period 03/18/2022. Physical Exam  Chaperone Chaperone: present  Constitutional General Appearance: healthy-appearing, well-nourished, well-developed  Psychiatric Orientation: to time, to place, to person Mood and Affect: active and alert, normal mood, normal affect  Abdomen Auscultation/Inspection/Palpation: normal bowel sounds, no tenderness, no CVA tenderness Hernia: none palpated  Female Genitalia Vulva: no masses, no atrophy, no lesions Bladder/Urethra: normal meatus, no urethral discharge, no urethral mass, bladder non distended Vagina no tenderness, no erythema, no abnormal vaginal discharge, no vesicle(s) or ulcers, no cystocele, no rectocele Uterus: non-tender, enlarged (> 22 week size fibroid uterus, can feel the fibroid posteriorly on exam and extending to just above umbilicus, unable to locate cervix visually or manually)  No results found for this or any previous visit (from the past 24 hour(s)).  No results found.  Assessment/Plan: 40Y P0 with abnormal uterine bleeding and bulk symptoms from 22 week size fibroid uterus. - Plan: total abdominal hysterectomy, bilateral salpingectomy - Informed consent obtained. Reviewed risk of infection, bleeding, damage to surrounding organs,  blood transfusion. All questions answered. Consent signed.  - Ancef 2g - SCDs - Will need liquid meds postop (cannot swallow pills)   Rowland Lathe 04/05/2022, 8:52  PM

## 2022-04-05 NOTE — Anesthesia Preprocedure Evaluation (Signed)
Anesthesia Evaluation  Patient identified by MRN, date of birth, ID band Patient awake    Reviewed: Allergy & Precautions, NPO status , Patient's Chart, lab work & pertinent test results  Airway Mallampati: II  TM Distance: >3 FB Neck ROM: Full    Dental  (+) Teeth Intact, Dental Advisory Given   Pulmonary neg pulmonary ROS,    Pulmonary exam normal breath sounds clear to auscultation       Cardiovascular hypertension, Pt. on medications Normal cardiovascular exam Rhythm:Regular Rate:Normal     Neuro/Psych negative neurological ROS     GI/Hepatic negative GI ROS, Neg liver ROS,   Endo/Other  Hypothyroidism Obesity   Renal/GU negative Renal ROS     Musculoskeletal negative musculoskeletal ROS (+)   Abdominal   Peds  Hematology negative hematology ROS (+)   Anesthesia Other Findings Day of surgery medications reviewed with the patient.  Reproductive/Obstetrics uterine leiomyoma                            Anesthesia Physical Anesthesia Plan  ASA: 2  Anesthesia Plan: General   Post-op Pain Management: Regional block*, Tylenol PO (pre-op)* and Ketamine IV*   Induction: Intravenous  PONV Risk Score and Plan: 4 or greater and Midazolam, Dexamethasone and Ondansetron  Airway Management Planned: Oral ETT  Additional Equipment:   Intra-op Plan:   Post-operative Plan: Extubation in OR  Informed Consent: I have reviewed the patients History and Physical, chart, labs and discussed the procedure including the risks, benefits and alternatives for the proposed anesthesia with the patient or authorized representative who has indicated his/her understanding and acceptance.     Dental advisory given  Plan Discussed with: CRNA  Anesthesia Plan Comments: (Bilateral TAP Block)       Anesthesia Quick Evaluation

## 2022-04-06 ENCOUNTER — Other Ambulatory Visit: Payer: Self-pay

## 2022-04-06 ENCOUNTER — Inpatient Hospital Stay (HOSPITAL_COMMUNITY): Payer: 59 | Admitting: Anesthesiology

## 2022-04-06 ENCOUNTER — Encounter (HOSPITAL_COMMUNITY)
Admission: RE | Disposition: A | Discharge status: Home / Self Care | Payer: Self-pay | Source: Ambulatory Visit | Attending: Obstetrics and Gynecology

## 2022-04-06 ENCOUNTER — Inpatient Hospital Stay (HOSPITAL_COMMUNITY)
Admission: RE | Admit: 2022-04-06 | Discharge: 2022-04-08 | DRG: 743 | Disposition: A | Discharge status: Home / Self Care | Payer: 59 | Source: Ambulatory Visit | Attending: Obstetrics and Gynecology | Admitting: Obstetrics and Gynecology

## 2022-04-06 ENCOUNTER — Encounter (HOSPITAL_COMMUNITY): Payer: Self-pay | Admitting: Obstetrics and Gynecology

## 2022-04-06 DIAGNOSIS — Z01818 Encounter for other preprocedural examination: Principal | ICD-10-CM

## 2022-04-06 DIAGNOSIS — Z90710 Acquired absence of both cervix and uterus: Secondary | ICD-10-CM | POA: Diagnosis present

## 2022-04-06 DIAGNOSIS — D259 Leiomyoma of uterus, unspecified: Secondary | ICD-10-CM | POA: Diagnosis not present

## 2022-04-06 DIAGNOSIS — D251 Intramural leiomyoma of uterus: Principal | ICD-10-CM | POA: Diagnosis present

## 2022-04-06 DIAGNOSIS — D252 Subserosal leiomyoma of uterus: Secondary | ICD-10-CM | POA: Diagnosis present

## 2022-04-06 DIAGNOSIS — I10 Essential (primary) hypertension: Secondary | ICD-10-CM | POA: Diagnosis present

## 2022-04-06 DIAGNOSIS — D250 Submucous leiomyoma of uterus: Secondary | ICD-10-CM | POA: Diagnosis present

## 2022-04-06 HISTORY — PX: HYSTERECTOMY ABDOMINAL WITH SALPINGECTOMY: SHX6725

## 2022-04-06 LAB — POCT PREGNANCY, URINE: Preg Test, Ur: NEGATIVE

## 2022-04-06 SURGERY — HYSTERECTOMY, TOTAL, ABDOMINAL, WITH SALPINGECTOMY
Anesthesia: General | Site: Abdomen

## 2022-04-06 MED ORDER — ONDANSETRON HCL 4 MG/2ML IJ SOLN: 4.0000 mg | Freq: Four times a day (QID) | INTRAMUSCULAR | Status: DC | PRN

## 2022-04-06 MED ORDER — HYDROMORPHONE HCL 1 MG/ML IJ SOLN
0.2000 mg | INTRAMUSCULAR | Status: DC | PRN
  Administered 2022-04-06: 0.2 mg via INTRAVENOUS
  Administered 2022-04-06 (×2): 0.4 mg via INTRAVENOUS
  Administered 2022-04-07: 0.2 mg via INTRAVENOUS
  Filled 2022-04-06 (×4): qty 1

## 2022-04-06 MED ORDER — MENTHOL 3 MG MT LOZG: 1.0000 | LOZENGE | OROMUCOSAL | Status: DC | PRN

## 2022-04-06 MED ORDER — ACETAMINOPHEN 160 MG/5ML PO SOLN
650.0000 mg | Freq: Four times a day (QID) | ORAL | Status: DC | PRN
  Administered 2022-04-07 (×2): 650 mg via ORAL
  Filled 2022-04-06 (×2): qty 20.3

## 2022-04-06 MED ORDER — OXYCODONE HCL 5 MG/5ML PO SOLN
5.0000 mg | ORAL | Status: DC | PRN
  Administered 2022-04-07 (×2): 5 mg via ORAL
  Administered 2022-04-08: 10 mg via ORAL
  Filled 2022-04-06: qty 10
  Filled 2022-04-06 (×2): qty 5

## 2022-04-06 MED ORDER — AMLODIPINE BESYLATE 5 MG PO TABS
5.0000 mg | ORAL_TABLET | Freq: Every day | ORAL | Status: DC
Start: 2022-04-07 — End: 2022-04-08
  Administered 2022-04-07: 5 mg via ORAL
  Filled 2022-04-06: qty 1

## 2022-04-06 MED ORDER — CHLORHEXIDINE GLUCONATE 0.12 % MT SOLN: 15.0000 mL | Freq: Once | OROMUCOSAL | Status: AC

## 2022-04-06 MED ORDER — ESMOLOL HCL-SODIUM CHLORIDE 2000 MG/100ML IV SOLN
INTRAVENOUS | Status: DC | PRN
  Administered 2022-04-06 (×3): 20 mg via INTRAVENOUS

## 2022-04-06 MED ORDER — SUGAMMADEX SODIUM 200 MG/2ML IV SOLN
INTRAVENOUS | Status: DC | PRN
  Administered 2022-04-06: 200 mg via INTRAVENOUS

## 2022-04-06 MED ORDER — CHLORHEXIDINE GLUCONATE 0.12 % MT SOLN
OROMUCOSAL | Status: AC
  Administered 2022-04-06: 15 mL via OROMUCOSAL
  Filled 2022-04-06: qty 15

## 2022-04-06 MED ORDER — MIDAZOLAM HCL 2 MG/2ML IJ SOLN
INTRAMUSCULAR | Status: DC | PRN
  Administered 2022-04-06: 2 mg via INTRAVENOUS

## 2022-04-06 MED ORDER — ONDANSETRON HCL 4 MG/2ML IJ SOLN
INTRAMUSCULAR | Status: DC | PRN
  Administered 2022-04-06: 4 mg via INTRAVENOUS

## 2022-04-06 MED ORDER — DEXAMETHASONE SODIUM PHOSPHATE 10 MG/ML IJ SOLN
INTRAMUSCULAR | Status: DC | PRN
  Administered 2022-04-06: 10 mg via INTRAVENOUS

## 2022-04-06 MED ORDER — MIDAZOLAM HCL 2 MG/2ML IJ SOLN
INTRAMUSCULAR | Status: AC
  Filled 2022-04-06: qty 2

## 2022-04-06 MED ORDER — BUPIVACAINE LIPOSOME 1.3 % IJ SUSP
INTRAMUSCULAR | Status: DC | PRN
  Administered 2022-04-06 (×2): 5 mL via PERINEURAL

## 2022-04-06 MED ORDER — SIMETHICONE 80 MG PO CHEW: 80.0000 mg | CHEWABLE_TABLET | Freq: Four times a day (QID) | ORAL | Status: DC | PRN

## 2022-04-06 MED ORDER — ORAL CARE MOUTH RINSE: 15.0000 mL | Freq: Once | OROMUCOSAL | Status: AC

## 2022-04-06 MED ORDER — KETOROLAC TROMETHAMINE 30 MG/ML IJ SOLN
INTRAMUSCULAR | Status: AC
  Filled 2022-04-06: qty 1

## 2022-04-06 MED ORDER — DEXAMETHASONE SODIUM PHOSPHATE 10 MG/ML IJ SOLN
INTRAMUSCULAR | Status: AC
  Filled 2022-04-06: qty 1

## 2022-04-06 MED ORDER — ONDANSETRON HCL 4 MG PO TABS: 4.0000 mg | ORAL_TABLET | Freq: Four times a day (QID) | ORAL | Status: DC | PRN

## 2022-04-06 MED ORDER — ONDANSETRON HCL 4 MG/2ML IJ SOLN: 4.0000 mg | Freq: Once | INTRAMUSCULAR | Status: DC | PRN

## 2022-04-06 MED ORDER — PROPOFOL 10 MG/ML IV BOLUS
INTRAVENOUS | Status: AC
  Filled 2022-04-06: qty 20

## 2022-04-06 MED ORDER — FENTANYL CITRATE (PF) 100 MCG/2ML IJ SOLN
25.0000 ug | INTRAMUSCULAR | Status: DC | PRN
  Administered 2022-04-06 (×2): 25 ug via INTRAVENOUS
  Administered 2022-04-06: 50 ug via INTRAVENOUS

## 2022-04-06 MED ORDER — ACETAMINOPHEN 10 MG/ML IV SOLN
1000.0000 mg | Freq: Four times a day (QID) | INTRAVENOUS | Status: AC
  Administered 2022-04-06 – 2022-04-07 (×3): 1000 mg via INTRAVENOUS
  Filled 2022-04-06 (×4): qty 100

## 2022-04-06 MED ORDER — KETAMINE HCL 10 MG/ML IJ SOLN
INTRAMUSCULAR | Status: DC | PRN
  Administered 2022-04-06 (×2): 20 mg via INTRAVENOUS
  Administered 2022-04-06: 10 mg via INTRAVENOUS

## 2022-04-06 MED ORDER — ACETAMINOPHEN 10 MG/ML IV SOLN
1000.0000 mg | Freq: Once | INTRAVENOUS | Status: AC
  Administered 2022-04-06: 1000 mg via INTRAVENOUS
  Filled 2022-04-06 (×2): qty 100

## 2022-04-06 MED ORDER — ROCURONIUM BROMIDE 10 MG/ML (PF) SYRINGE
PREFILLED_SYRINGE | INTRAVENOUS | Status: DC | PRN
  Administered 2022-04-06: 60 mg via INTRAVENOUS

## 2022-04-06 MED ORDER — LACTATED RINGERS IV SOLN: INTRAVENOUS | Status: DC

## 2022-04-06 MED ORDER — IBUPROFEN 100 MG/5ML PO SUSP
600.0000 mg | Freq: Four times a day (QID) | ORAL | Status: DC
  Administered 2022-04-07 – 2022-04-08 (×3): 600 mg via ORAL
  Filled 2022-04-06 (×3): qty 30

## 2022-04-06 MED ORDER — FENTANYL CITRATE (PF) 250 MCG/5ML IJ SOLN
INTRAMUSCULAR | Status: AC
  Filled 2022-04-06: qty 5

## 2022-04-06 MED ORDER — ESMOLOL HCL 100 MG/10ML IV SOLN
INTRAVENOUS | Status: AC
  Filled 2022-04-06: qty 10

## 2022-04-06 MED ORDER — LIDOCAINE 2% (20 MG/ML) 5 ML SYRINGE
INTRAMUSCULAR | Status: AC
  Filled 2022-04-06: qty 5

## 2022-04-06 MED ORDER — ONDANSETRON HCL 4 MG/2ML IJ SOLN
INTRAMUSCULAR | Status: AC
  Filled 2022-04-06: qty 2

## 2022-04-06 MED ORDER — KETOROLAC TROMETHAMINE 30 MG/ML IJ SOLN
30.0000 mg | Freq: Four times a day (QID) | INTRAMUSCULAR | Status: AC
  Administered 2022-04-06 – 2022-04-07 (×4): 30 mg via INTRAVENOUS
  Filled 2022-04-06 (×5): qty 1

## 2022-04-06 MED ORDER — BUPIVACAINE HCL (PF) 0.25 % IJ SOLN
INTRAMUSCULAR | Status: DC | PRN
  Administered 2022-04-06 (×2): 20 mL via PERINEURAL

## 2022-04-06 MED ORDER — KETAMINE HCL 50 MG/5ML IJ SOSY
PREFILLED_SYRINGE | INTRAMUSCULAR | Status: AC
  Filled 2022-04-06: qty 5

## 2022-04-06 MED ORDER — LEVOTHYROXINE SODIUM 25 MCG PO TABS
175.0000 ug | ORAL_TABLET | Freq: Every day | ORAL | Status: DC
  Filled 2022-04-06: qty 7

## 2022-04-06 MED ORDER — FENTANYL CITRATE (PF) 100 MCG/2ML IJ SOLN
INTRAMUSCULAR | Status: AC
Start: 2022-04-06 — End: 2022-04-06
  Filled 2022-04-06: qty 2

## 2022-04-06 MED ORDER — FENTANYL CITRATE (PF) 250 MCG/5ML IJ SOLN
INTRAMUSCULAR | Status: DC | PRN
  Administered 2022-04-06 (×4): 50 ug via INTRAVENOUS

## 2022-04-06 MED ORDER — ROCURONIUM BROMIDE 10 MG/ML (PF) SYRINGE
PREFILLED_SYRINGE | INTRAVENOUS | Status: AC
  Filled 2022-04-06: qty 10

## 2022-04-06 MED ORDER — POVIDONE-IODINE 10 % EX SWAB
2.0000 "application " | Freq: Once | CUTANEOUS | Status: AC
  Administered 2022-04-06: 2 via TOPICAL

## 2022-04-06 MED ORDER — EPHEDRINE SULFATE (PRESSORS) 50 MG/ML IJ SOLN: INTRAMUSCULAR | Status: DC | PRN

## 2022-04-06 MED ORDER — PROPOFOL 10 MG/ML IV BOLUS
INTRAVENOUS | Status: DC | PRN
  Administered 2022-04-06: 170 mg via INTRAVENOUS
  Administered 2022-04-06: 30 mg via INTRAVENOUS

## 2022-04-06 MED ORDER — LIDOCAINE 2% (20 MG/ML) 5 ML SYRINGE
INTRAMUSCULAR | Status: DC | PRN
  Administered 2022-04-06: 40 mg via INTRAVENOUS

## 2022-04-06 MED ORDER — BUPIVACAINE HCL (PF) 0.25 % IJ SOLN: INTRAMUSCULAR | Status: DC | PRN

## 2022-04-06 MED ORDER — CEFAZOLIN SODIUM-DEXTROSE 2-4 GM/100ML-% IV SOLN
2.0000 g | INTRAVENOUS | Status: AC
  Administered 2022-04-06: 2 g via INTRAVENOUS
  Filled 2022-04-06: qty 100

## 2022-04-06 MED ORDER — KETOROLAC TROMETHAMINE 30 MG/ML IJ SOLN
INTRAMUSCULAR | Status: DC | PRN
  Administered 2022-04-06: 30 mg via INTRAVENOUS

## 2022-04-06 MED ORDER — MAGNESIUM HYDROXIDE 400 MG/5ML PO SUSP
30.0000 mL | Freq: Every day | ORAL | Status: DC
  Administered 2022-04-07: 30 mL via ORAL
  Filled 2022-04-06: qty 30

## 2022-04-06 MED ORDER — SOD CITRATE-CITRIC ACID 500-334 MG/5ML PO SOLN
30.0000 mL | ORAL | Status: AC
  Administered 2022-04-06: 30 mL via ORAL
  Filled 2022-04-06: qty 30

## 2022-04-06 MED ORDER — 0.9 % SODIUM CHLORIDE (POUR BTL) OPTIME
TOPICAL | Status: DC | PRN
  Administered 2022-04-06 (×2): 1000 mL

## 2022-04-06 SURGICAL SUPPLY — 39 items
APL SKNCLS STERI-STRIP NONHPOA (GAUZE/BANDAGES/DRESSINGS) ×1
BAG COUNTER SPONGE SURGICOUNT (BAG) ×2 IMPLANT
BAG SPNG CNTER NS LX DISP (BAG) ×1
BENZOIN TINCTURE PRP APPL 2/3 (GAUZE/BANDAGES/DRESSINGS) ×2 IMPLANT
CANISTER SUCT 3000ML PPV (MISCELLANEOUS) ×2 IMPLANT
DECANTER SPIKE VIAL GLASS SM (MISCELLANEOUS) IMPLANT
DRAPE WARM FLUID 44X44 (DRAPES) IMPLANT
DRSG OPSITE POSTOP 4X10 (GAUZE/BANDAGES/DRESSINGS) ×2 IMPLANT
DURAPREP 26ML APPLICATOR (WOUND CARE) ×2 IMPLANT
GAUZE 4X4 16PLY ~~LOC~~+RFID DBL (SPONGE) IMPLANT
GLOVE BIO SURGEON STRL SZ 6 (GLOVE) ×2 IMPLANT
GLOVE BIOGEL PI IND STRL 6.5 (GLOVE) ×1 IMPLANT
GLOVE BIOGEL PI INDICATOR 6.5 (GLOVE) ×1
GLOVE SURG UNDER POLY LF SZ7 (GLOVE) ×4 IMPLANT
GOWN STRL REUS W/ TWL LRG LVL3 (GOWN DISPOSABLE) ×2 IMPLANT
GOWN STRL REUS W/TWL LRG LVL3 (GOWN DISPOSABLE) ×4
HIBICLENS CHG 4% 4OZ BTL (MISCELLANEOUS) ×2 IMPLANT
KIT TURNOVER KIT B (KITS) ×2 IMPLANT
PACK ABDOMINAL GYN (CUSTOM PROCEDURE TRAY) ×2 IMPLANT
PAD ARMBOARD 7.5X6 YLW CONV (MISCELLANEOUS) ×2 IMPLANT
PAD OB MATERNITY 4.3X12.25 (PERSONAL CARE ITEMS) ×2 IMPLANT
SPECIMEN JAR MEDIUM (MISCELLANEOUS) ×2 IMPLANT
SPONGE T-LAP 18X18 ~~LOC~~+RFID (SPONGE) ×4 IMPLANT
STRIP CLOSURE SKIN 1/2X4 (GAUZE/BANDAGES/DRESSINGS) ×2 IMPLANT
SUT MNCRL AB 3-0 PS2 27 (SUTURE) ×2 IMPLANT
SUT PDS AB 0 CTX 60 (SUTURE) ×4 IMPLANT
SUT PLAIN 2 0 XLH (SUTURE) IMPLANT
SUT VIC AB 0 CT1 18XCR BRD8 (SUTURE) ×2 IMPLANT
SUT VIC AB 0 CT1 8-18 (SUTURE) ×4
SUT VIC AB 0 CTX 36 (SUTURE) ×4
SUT VIC AB 0 CTX36XBRD ANBCTRL (SUTURE) ×2 IMPLANT
SUT VIC AB 2-0 CT1 (SUTURE) ×4 IMPLANT
SUT VIC AB 2-0 CT1 27 (SUTURE) ×2
SUT VIC AB 2-0 CT1 TAPERPNT 27 (SUTURE) IMPLANT
SUT VIC AB 4-0 KS 27 (SUTURE) ×1 IMPLANT
SUT VICRYL 0 TIES 12 18 (SUTURE) ×2 IMPLANT
TOWEL GREEN STERILE FF (TOWEL DISPOSABLE) ×4 IMPLANT
TRAY FOLEY W/BAG SLVR 16FR (SET/KITS/TRAYS/PACK) ×2
TRAY FOLEY W/BAG SLVR 16FR ST (SET/KITS/TRAYS/PACK) ×1 IMPLANT

## 2022-04-06 NOTE — Anesthesia Postprocedure Evaluation (Signed)
Anesthesia Post Note  Patient: Cindy Le  Procedure(s) Performed: ABDOMINAL HYSTERECTOMY WITH BILATERAL SALPINGECTOMY (Abdomen)     Patient location during evaluation: PACU Anesthesia Type: General Level of consciousness: awake and alert Pain management: pain level controlled Vital Signs Assessment: post-procedure vital signs reviewed and stable Respiratory status: spontaneous breathing, nonlabored ventilation, respiratory function stable and patient connected to nasal cannula oxygen Cardiovascular status: blood pressure returned to baseline and stable Postop Assessment: no apparent nausea or vomiting Anesthetic complications: no   No notable events documented.  Last Vitals:  Vitals:   04/06/22 0934 04/06/22 0949  BP: (!) 155/99 (!) 148/89  Pulse: 83 72  Resp:  17  Temp: (!) 36.1 C   SpO2: 100% 97%    Last Pain:  Vitals:   04/06/22 0934  TempSrc:   PainSc: 0-No pain                 Santa Lighter

## 2022-04-06 NOTE — Progress Notes (Signed)
GYN Progress Note  POD0 TAH, bilateral salpingectomy  S: Doing well. States pain is tolerable. Tolerating PO without nausea or vomiting. Foley in place, not yet ambulating.    O: Today's Vitals   04/06/22 1313 04/06/22 1523 04/06/22 1625 04/06/22 1643  BP:   (!) 151/91 (!) 151/77  Pulse:   63 62  Resp:   16   Temp:   98 F (36.7 C)   TempSrc:   Oral   SpO2:   99% 99%  Weight:      Height:      PainSc: Asleep 8   7    Body mass index is 35.28 kg/m.  General: no acute distress, resting in bed CVS: regular rate and rhythm Lungs: clear to auscultation bilaterally Abd: soft, minimally distended, appropriately tender to palpation Incision: pfannenstiel incision covered with honeycomb dressing, small area of blood staining circled GU: foley in place draining clear yellow urine Ext: no calf edema or tenderness, SCDs on and cycling  CBC    Component Value Date/Time   WBC 5.4 03/30/2022 1431   RBC 4.12 03/30/2022 1431   HGB 12.0 03/30/2022 1431   HCT 36.9 03/30/2022 1431   PLT 371 03/30/2022 1431   MCV 89.6 03/30/2022 1431   MCH 29.1 03/30/2022 1431   MCHC 32.5 03/30/2022 1431   RDW 15.3 03/30/2022 1431   LYMPHSABS 1.8 03/30/2022 1431   MONOABS 0.5 03/30/2022 1431   EOSABS 0.9 (H) 03/30/2022 1431   BASOSABS 0.0 03/30/2022 1431    A/P: POD0 s/p TAH, bilateral salpingectomy for large multifibroid uterus - Surgery and photos reviewed with patient.  - ERAS protocol - Liquid and IV meds (cannot swallow pills except very small ones like her synthroid and amlodipine) - D/c foley, follow up void, encourage ambulation - Advance diet as tolerated - HTN: took her amloidipine this AM, blood pressures normal to mildly elevated, usually well controlled so may be a pain component, will monitor.  - Intraop EBL 150, check CBC in AM  M. Brien Mates, MD 04/06/22 6:11 PM

## 2022-04-06 NOTE — Brief Op Note (Signed)
04/06/2022  9:37 AM  PATIENT:  Cindy Le  41 y.o. female  PRE-OPERATIVE DIAGNOSIS:  UTERINE FIBROIDS  POST-OPERATIVE DIAGNOSIS:  UTERINE FIBROIDS  PROCEDURE:  Procedure(s): ABDOMINAL HYSTERECTOMY WITH BILATERAL SALPINGECTOMY (N/A)  SURGEON:  Surgeon(s) and Role:    * Yasmine Kilbourne, Edwinna Areola, MD - Primary    * Bobbye Charleston, MD - Assisting  ANESTHESIA:   general and TAP block  EBL:  150 mL   BLOOD ADMINISTERED:none  DRAINS: Urinary Catheter (Foley)   LOCAL MEDICATIONS USED:  NONE  SPECIMEN:  Source of Specimen:  uterus, cervix, bilateral fallopian tubes  DISPOSITION OF SPECIMEN:  PATHOLOGY  COUNTS:  YES  TOURNIQUET:  * No tourniquets in log *  DICTATION: .Note written in EPIC  PLAN OF CARE: Admit to inpatient   PATIENT DISPOSITION:  PACU - hemodynamically stable.   Delay start of Pharmacological VTE agent (>24hrs) due to surgical blood loss or risk of bleeding: no  M. Brien Mates, MD 04/06/22 9:38 AM

## 2022-04-06 NOTE — Anesthesia Procedure Notes (Signed)
Anesthesia Regional Block: TAP block   Pre-Anesthetic Checklist: , timeout performed,  Correct Patient, Correct Site, Correct Laterality,  Correct Procedure, Correct Position, site marked,  Risks and benefits discussed,  Surgical consent,  Pre-op evaluation,  At surgeon's request and post-op pain management  Laterality: Right  Prep: chloraprep       Needles:  Injection technique: Single-shot  Needle Type: Echogenic Needle     Needle Length: 9cm  Needle Gauge: 21     Additional Needles:   Procedures:,,,, ultrasound used (permanent image in chart),,    Narrative:  Start time: 04/06/2022 6:55 AM End time: 04/06/2022 7:00 AM Injection made incrementally with aspirations every 5 mL.  Performed by: Personally  Anesthesiologist: Santa Lighter, MD  Additional Notes: No pain on injection. No increased resistance to injection. Injection made in 5cc increments.  Good needle visualization.  Patient tolerated procedure well.

## 2022-04-06 NOTE — Plan of Care (Signed)
  Problem: Education: Goal: Knowledge of General Education information will improve Description: Including pain rating scale, medication(s)/side effects and non-pharmacologic comfort measures Outcome: Completed/Met

## 2022-04-06 NOTE — Transfer of Care (Signed)
Immediate Anesthesia Transfer of Care Note  Patient: Cindy Le  Procedure(s) Performed: ABDOMINAL HYSTERECTOMY WITH BILATERAL SALPINGECTOMY (Abdomen)  Patient Location: PACU   Anesthesia Type:GA combined with regional for post-op pain  Level of Consciousness: drowsy and patient cooperative  Airway & Oxygen Therapy: Patient Spontanous Breathing and Patient connected to nasal cannula oxygen  Post-op Assessment: Report given to RN, Post -op Vital signs reviewed and stable and Patient moving all extremities X 4  Post vital signs: Reviewed and stable  Last Vitals:  Vitals Value Taken Time  BP 155/99 04/06/22 0934  Temp    Pulse 80 04/06/22 0938  Resp 14 04/06/22 0938  SpO2 97 % 04/06/22 0938  Vitals shown include unvalidated device data.  Last Pain:  Vitals:   04/06/22 0613  TempSrc:   PainSc: 0-No pain         Complications: No notable events documented.

## 2022-04-06 NOTE — Anesthesia Procedure Notes (Signed)
Anesthesia Regional Block: TAP block   Pre-Anesthetic Checklist: , timeout performed,  Correct Patient, Correct Site, Correct Laterality,  Correct Procedure, Correct Position, site marked,  Risks and benefits discussed,  Surgical consent,  Pre-op evaluation,  At surgeon's request and post-op pain management  Laterality: Left  Prep: chloraprep       Needles:  Injection technique: Single-shot  Needle Type: Echogenic Needle     Needle Length: 9cm  Needle Gauge: 21     Additional Needles:   Procedures:,,,, ultrasound used (permanent image in chart),,    Narrative:  Start time: 04/06/2022 6:50 AM End time: 04/06/2022 6:55 AM Injection made incrementally with aspirations every 5 mL.  Performed by: Personally  Anesthesiologist: Santa Lighter, MD  Additional Notes: No pain on injection. No increased resistance to injection. Injection made in 5cc increments.  Good needle visualization.  Patient tolerated procedure well.

## 2022-04-06 NOTE — Anesthesia Procedure Notes (Signed)
Procedure Name: Intubation Date/Time: 04/06/2022 7:29 AM Performed by: Michele Rockers, CRNA Pre-anesthesia Checklist: Patient identified, Patient being monitored, Timeout performed, Emergency Drugs available and Suction available Patient Re-evaluated:Patient Re-evaluated prior to induction Oxygen Delivery Method: Circle System Utilized Preoxygenation: Pre-oxygenation with 100% oxygen Induction Type: IV induction Ventilation: Mask ventilation without difficulty Laryngoscope Size: Miller and 2 Grade View: Grade I Tube type: Oral Tube size: 7.0 mm Number of attempts: 1 Airway Equipment and Method: Stylet Placement Confirmation: ETT inserted through vocal cords under direct vision, positive ETCO2 and breath sounds checked- equal and bilateral Secured at: 21 cm Tube secured with: Tape Dental Injury: Teeth and Oropharynx as per pre-operative assessment

## 2022-04-06 NOTE — Op Note (Signed)
OPERATIVE NOTE  04/06/2022   9:37 AM   PATIENT:  Cindy Le  41 y.o. female   PRE-OPERATIVE DIAGNOSIS:  UTERINE FIBROIDS   POST-OPERATIVE DIAGNOSIS:  UTERINE FIBROIDS   PROCEDURE:  Procedure(s): ABDOMINAL HYSTERECTOMY WITH BILATERAL SALPINGECTOMY (N/A)   SURGEON:  Surgeon(s) and Role:    * Iosefa Weintraub, Edwinna Areola, MD - Primary    * Bobbye Charleston, MD - Assisting   ANESTHESIA:   general and TAP block   EBL:  150 mL    BLOOD ADMINISTERED:none   DRAINS: Urinary Catheter (Foley)    LOCAL MEDICATIONS USED:  NONE   SPECIMEN:  Source of Specimen:  uterus, cervix, bilateral fallopian tubes   DISPOSITION OF SPECIMEN:  PATHOLOGY   COUNTS:  YES   PLAN OF CARE: Admit to inpatient    PATIENT DISPOSITION:  PACU - hemodynamically stable.  COMPLICATIONS: none  FINDINGS: enlarged 21 week size uterus with 8cm pedunculated fibroid from the right fundus extending into the right upper quadrant of her abdomen, a 5cm subserosal fibroid from the left fundus, and 7cm intramural fibroid in the posterior lower uterine segment. Normal appearing ovaries and fallopian tubes bilaterally. Bilateral ureters visualized peristalsing.    PROCEDURE IN DETAIL: The patient was appropriately consented. She received a TAP block preoperative by anesthesia.  She was taken to the operating room. General anesthesia was obtained without difficulty. She was placed in the supine position. Thromboguards were on and cycling. Two grams of Ancef were given for infection prophylaxis. A foley was inserted and draining clear yellow urine. She was prepped and draped in normal sterile fashion.     A Pfannenstiel skin incision was made with the scalpel. The incision was carried down to the fascia with the scalpel. The fascia was incised and extended laterally with Mayo scissors. The inferior aspect of the fascia was grasped with the Kocher clamps. The underlying rectus muscle and pyramidalis were dissected off bluntly. In a  similar fashion, the superior aspect of the fascia was elevated with the Kochers, and the rectus muscle was dissected off. The rectus muscle was separated in the midline. The peritoneum was found the be free of adherent bowel and entered with scissors.   Intraabdominal survey revealed findings as noted above. The  uterus and fibroids were delivered through the laparotomy. The bowel was then packed and an Harmon Pier was placed. Bilateral fallopian tubes were serially ligated and removed with the Ligasure device. Upward traction was applied to facilitate exposure and dissection. The round ligaments were identified and ligated with the Ligasure.  The anterior leaf of the broad ligaments were dissected to develop a bladder flap. The uterine vessels were skeletonized, clamped and ligated with the Ligasure. Good hemostasis was noted. Due to the large posterior lower uterine segment fibroid, decision was made to separate the uterus with fibroids from the cervix to allow better visualization of the pelvis, this was accomplished with a Ligasure. The cervix was then separated from the vagina with the Ligasure. The vaginal cuff was closed with 0 vicryl in figure of 8 stitches.    The pelvis was irrigated and appreciated to be hemostatic. The pelvic sidewalls were dissected to visualize bilateral ureters, which were noted to be peristalsing and running lateral to the area of uterine dissection.    The peritoneum was closed with 0 vicryl in a running fashion, incorporating the rectus muscles. The fascial layer was closed with 0 vicryl in a running fashion. The subcutaneous tissue layer was irrigated, rendered hemostatic,  and reapproximated with 3-0 vicryl in a running fashion. The skin was closed with 4-0 vicryl in a subcuticular fashion. Steri strips and a Honey comb dressing were applied to the incision. The patient tolerated the procedure well. All counts were correct times two. The patient was taken  to the recovery room in a stable condition.  [facility] Redo, MD 04/06/22 9:47 AM

## 2022-04-07 ENCOUNTER — Encounter (HOSPITAL_COMMUNITY): Payer: Self-pay | Admitting: Obstetrics and Gynecology

## 2022-04-07 LAB — CBC
HCT: 33 % — ABNORMAL LOW (ref 36.0–46.0)
Hemoglobin: 11.4 g/dL — ABNORMAL LOW (ref 12.0–15.0)
MCH: 29.7 pg (ref 26.0–34.0)
MCHC: 34.5 g/dL (ref 30.0–36.0)
MCV: 85.9 fL (ref 80.0–100.0)
Platelets: 316 10*3/uL (ref 150–400)
RBC: 3.84 MIL/uL — ABNORMAL LOW (ref 3.87–5.11)
RDW: 15.6 % — ABNORMAL HIGH (ref 11.5–15.5)
WBC: 8.2 10*3/uL (ref 4.0–10.5)
nRBC: 0 % (ref 0.0–0.2)

## 2022-04-07 LAB — SURGICAL PATHOLOGY

## 2022-04-07 MED ORDER — OXYCODONE HCL 5 MG/5ML PO SOLN: 5.0000 mg | ORAL | 0 refills | Status: AC | PRN

## 2022-04-07 MED ORDER — IBUPROFEN 100 MG/5ML PO SUSP: 600.0000 mg | Freq: Four times a day (QID) | ORAL | 1 refills | Status: AC | PRN

## 2022-04-07 MED ORDER — ACETAMINOPHEN 160 MG/5ML PO SOLN: 650.0000 mg | Freq: Four times a day (QID) | ORAL | 1 refills | Status: AC

## 2022-04-07 NOTE — Progress Notes (Signed)
GYN Progress Note  POD1 TAH, bilateral salpingectomy  S: Doing well this morning. Pain  controlled with regimen, currently 4/10. Foley was removed yesterday and she voided without issues. Ambulating. Tolerating PO without nausea or vomiting. No VB. No chest pain or SOB. No flatus.    O: Today's Vitals   04/06/22 2158 04/06/22 2343 04/07/22 0207 04/07/22 0610  BP:      Pulse:      Resp:      Temp:      TempSrc:      SpO2:      Weight:      Height:      PainSc: '3  8  2  6    '$ Body mass index is 35.28 kg/m.    General: no acute distress, resting in bed CVS: regular rate and rhythm Lungs: clear to auscultation bilaterally Abd: soft, minimally distended, appropriately tender to palpation Incision: pfannenstiel incision covered with honeycomb dressing, small area of blood staining circled and has not expanded Ext: no calf edema or tenderness, SCDs on and cycling  CBC    Component Value Date/Time   WBC 8.2 04/07/2022 0448   RBC 3.84 (L) 04/07/2022 0448   HGB 11.4 (L) 04/07/2022 0448   HCT 33.0 (L) 04/07/2022 0448   PLT 316 04/07/2022 0448   MCV 85.9 04/07/2022 0448   MCH 29.7 04/07/2022 0448   MCHC 34.5 04/07/2022 0448   RDW 15.6 (H) 04/07/2022 0448   LYMPHSABS 1.8 03/30/2022 1431   MONOABS 0.5 03/30/2022 1431   EOSABS 0.9 (H) 03/30/2022 1431   BASOSABS 0.0 03/30/2022 1431    A/P: POD1 s/p TAH, bilateral salpingectomy for large multifibroid uterus - ERAS protocol - - Intraop EBL 150, check hgb 11.4 (from 12.0) this morning - appropriate - Transition to PO (liquid meds today) after 24 hours IV toradol/tylenol.  - Encourage ambulation - HTN: home amlodipine, monitor BP - Dispo: anticipate discharge tomorrow   M. Brien Mates, MD 04/07/22 8:45 AM

## 2022-04-08 NOTE — Progress Notes (Signed)
GYN Progress Note  PO2 TAH/bilateral salpingectomy  S: Doing well and eager for discharge. Ambulating, voiding, tolerating PO. Passing flatus. Pain controlled. No chest pain or SOB.    O: Today's Vitals   04/08/22 0546 04/08/22 0630 04/08/22 0740 04/08/22 0741  BP:   (!) 141/88   Pulse:   69   Resp:   17   Temp:   98.8 F (37.1 C)   TempSrc:   Oral   SpO2:   99%   Weight:      Height:      PainSc: Asleep Asleep  6    Body mass index is 35.28 kg/m.  General: no acute distress, sitting up in chair CVS: regular rate and rhythm Lungs: clear to auscultation bilaterally Abd: soft, minimally distended, appropriately tender to palpation Incision: pfannenstiel incision covered with honeycomb dressing Ext: no calf edema or tenderness, SCDs on and cycling  CBC    Component Value Date/Time   WBC 8.2 04/07/2022 0448   RBC 3.84 (L) 04/07/2022 0448   HGB 11.4 (L) 04/07/2022 0448   HCT 33.0 (L) 04/07/2022 0448   PLT 316 04/07/2022 0448   MCV 85.9 04/07/2022 0448   MCH 29.7 04/07/2022 0448   MCHC 34.5 04/07/2022 0448   RDW 15.6 (H) 04/07/2022 0448   LYMPHSABS 1.8 03/30/2022 1431   MONOABS 0.5 03/30/2022 1431   EOSABS 0.9 (H) 03/30/2022 1431   BASOSABS 0.0 03/30/2022 1431    A/P: POD2 s/p TAH, bilateral salpingectomy for large multifibroid uterus - ERAS protocol - - Intraop EBL 150: hgb 11.4 (from 12.0) - Encourage ambulation - HTN: home amlodipine, blood pressures controlled - Dispo: discharge home today  M. Brien Mates, MD 04/08/22 9:31 AM

## 2022-04-08 NOTE — Plan of Care (Signed)
Pt to be discharged home with printed instructions. No concerns noted. [place] Sorn L Jenine Krisher, RN  

## 2022-04-08 NOTE — Discharge Summary (Signed)
Physician Discharge Summary  Patient ID: Cindy Le MRN: [id number] DOB/AGE: 43-Dec-1982 43 y.o.  Admit date: 04/06/2022 Discharge date: 04/08/2022  Admission Diagnoses: Leiomyoma  Discharge Diagnoses:  Principal Problem:   H/O total hysterectomy   Discharged Condition: good  Hospital Course:  04/06/22: Admitted for scheduled surgery - underwent total abdominal hysterectomy, bilateral salpingectomy.  04/07/22: POD1 ambulating, voiding, tolerating PO 04/08/22: POD2 meeting postop milestones, stable for discharge  Consults: None  Significant Diagnostic Studies: labs: postop hgb 11.4  Treatments: surgery  Discharge Exam: Blood pressure (!) 141/88, pulse 69, temperature 98.8 F (37.1 C), temperature source Oral, resp. rate 17, height '5\' 5"'$  (1.651 m), weight 96.2 kg, last menstrual period 03/18/2022, SpO2 99 %. General: no acute distress, resting in bed CVS: regular rate and rhythm Lungs: clear to auscultation bilaterally Abd: soft, minimally distended, appropriately tender to palpation Incision: pfannenstiel incision covered with honeycomb dressing, small area of blood staining circled and has not expanded Ext: no calf edema or tenderness, SCDs on and cycling  Disposition: Discharge disposition: 01-Home or Self Care       Discharge Instructions      Remove dressing in 72 hours   Complete by: As directed    Call MD for:   Complete by: As directed    Period like bleeding or abnormal vaginal discharge   Call MD for:  difficulty breathing, headache or visual disturbances   Complete by: As directed    Call MD for:  extreme fatigue   Complete by: As directed    Call MD for:  hives   Complete by: As directed    Call MD for:  persistant dizziness or light-headedness   Complete by: As directed    Call MD for:  persistant nausea and vomiting   Complete by: As directed    Call MD for:  redness, tenderness, or signs of infection (pain, swelling, redness, odor or  green/yellow discharge around incision site)   Complete by: As directed    Call MD for:  severe uncontrolled pain   Complete by: As directed    Call MD for:  temperature >100.4   Complete by: As directed    Diet - low sodium heart healthy   Complete by: As directed    Driving Restrictions   Complete by: As directed    No driving while taking narcotics   Increase activity slowly   Complete by: As directed    Lifting restrictions   Complete by: As directed    No heavy lifting more than 10-15 lbs   Other Restrictions   Complete by: As directed    No bath/pool/soaking   Sexual Activity Restrictions   Complete by: As directed    Nothing in vagina x 6 weeks      Allergies as of 04/08/2022   No Known Allergies      Medication List     STOP taking these medications    ibuprofen 200 MG tablet Commonly known as: ADVIL Replaced by: ibuprofen 100 MG/5ML suspension       TAKE these medications    acetaminophen 160 MG/5ML solution Commonly known as: TYLENOL Take 20.3 mLs (650 mg total) by mouth every 6 (six) hours.   amLODipine 5 MG tablet Commonly known as: NORVASC Take 5 mg by mouth daily.   augmented betamethasone dipropionate 0.05 % cream Commonly known as: DIPROLENE-AF Apply 1 application. topically 2 (two) times daily as needed (eczema).   fluocinonide-emollient 0.05 % cream Commonly known as: LIDEX-E Apply 1  application. topically See admin instructions. Every time hands are washed   ibuprofen 100 MG/5ML suspension Commonly known as: ADVIL Take 30 mLs (600 mg total) by mouth every 6 (six) hours as needed. Replaces: ibuprofen 200 MG tablet   levothyroxine 175 MCG tablet Commonly known as: SYNTHROID Take 175 mcg by mouth daily before breakfast.   oxyCODONE 5 MG/5ML solution Commonly known as: ROXICODONE Take 5-10 mLs (5-10 mg total) by mouth every 4 (four) hours as needed for moderate pain.        Follow-up Information     Rowland Lathe, MD.  Go on 04/17/2022.   Specialty: Obstetrics and Gynecology Why: Postop appointment 04/17/22 at 10:30am Contact information: [address] [place] [address] [phone]                 Signed: Rowland Lathe 04/08/2022, 9:27 AM

## 2023-01-23 ENCOUNTER — Ambulatory Visit: Payer: 59 | Admitting: Podiatry

## 2023-01-23 ENCOUNTER — Other Ambulatory Visit: Payer: Self-pay | Admitting: Podiatry

## 2023-01-23 ENCOUNTER — Ambulatory Visit (INDEPENDENT_AMBULATORY_CARE_PROVIDER_SITE_OTHER): Payer: 59

## 2023-01-23 DIAGNOSIS — M79671 Pain in right foot: Secondary | ICD-10-CM | POA: Diagnosis not present

## 2023-01-23 DIAGNOSIS — Q666 Other congenital valgus deformities of feet: Secondary | ICD-10-CM

## 2023-01-23 DIAGNOSIS — M722 Plantar fascial fibromatosis: Secondary | ICD-10-CM

## 2023-01-23 NOTE — Progress Notes (Signed)
Subjective:  Patient ID: Cindy Le, female    DOB: [birth year],  MRN: [id number]  Chief Complaint  Patient presents with   Foot Pain    42 y.o. female presents with the above complaint.  Patient presents with right midfoot Planter fasciitis.  Patient states painful to touch.  Is getting worse.  Hurts with ambulation worse with standing she wanted discuss treatment options for this she has not seen anyone as prior to seeing me denies any other acute complaints.  She also has some flexible flatfoot.  She would like to discuss orthotics option for   Review of Systems: Negative except as noted in the HPI. Denies N/V/F/Ch.  Past Medical History:  Diagnosis Date   Eczema    Hypertension    Obesity    Thyroid disease     Current Outpatient Medications:    amLODipine (NORVASC) 5 MG tablet, Take 5 mg by mouth daily., Disp: , Rfl:    augmented betamethasone dipropionate (DIPROLENE-AF) 0.05 % cream, Apply 1 application. topically 2 (two) times daily as needed (eczema)., Disp: , Rfl:    fluocinonide-emollient (LIDEX-E) 0.05 % cream, Apply 1 application. topically See admin instructions. Every time hands are washed, Disp: , Rfl:    ibuprofen (ADVIL) 100 MG/5ML suspension, Take 30 mLs (600 mg total) by mouth every 6 (six) hours as needed., Disp: 3600 mL, Rfl: 1   levothyroxine (SYNTHROID) 175 MCG tablet, Take 175 mcg by mouth daily before breakfast., Disp: , Rfl:    oxyCODONE (ROXICODONE) 5 MG/5ML solution, Take 5-10 mLs (5-10 mg total) by mouth every 4 (four) hours as needed for moderate pain., Disp: 60 mL, Rfl: 0  Social History   Tobacco Use  Smoking Status Never  Smokeless Tobacco Never    No Known Allergies Objective:  There were no vitals filed for this visit. There is no height or weight on file to calculate BMI. Constitutional Well developed. Well nourished.  Vascular Dorsalis pedis pulses palpable bilaterally. Posterior tibial pulses palpable bilaterally. Capillary  refill normal to all digits.  No cyanosis or clubbing noted. Pedal hair growth normal.  Neurologic Normal speech. Oriented to person, place, and time. Epicritic sensation to light touch grossly present bilaterally.  Dermatologic Nails well groomed and normal in appearance. No open wounds. No skin lesions.  Orthopedic: Normal joint ROM without pain or crepitus bilaterally. No visible deformities. Tender to palpation at the midfoot right. No pain with calcaneal squeeze right. Ankle ROM diminished range of motion right. Silfverskiold Test: positive right.  Gait examination shows semiflexible pes planovalgus deformity with calcaneovalgus to many toe signs partially able to recruit the arch with dorsiflexion of the hallux unable to perform single and double heel raise   Radiographs: 3 views of skeletally mature adult right foot: Plantar heel spurring noted positive Haglund's deformity noted.  No other bony abnormalities identified.  No arthritis noted.  Assessment:   1. Plantar fasciitis of right foot   2. Pes planovalgus    Plan:  Patient was evaluated and treated and all questions answered.  Plantar Fasciitis, right midfoot - XR reviewed as above.  - Educated on icing and stretching. Instructions given.  - Injection delivered to the plantar fascia as below. - DME: Plantar fascial brace dispensed to support the medial longitudinal arch of the foot and offload pressure from the heel and prevent arch collapse during weightbearing - Pharmacologic management: None  Pes planovalgus -I explained to patient the etiology of pes planovalgus and relationship with Planter fasciitis and various  treatment options were discussed.  Given patient foot structure in the setting of Planter fasciitis I believe patient will benefit from custom-made orthotics to help control the hindfoot motion support the arch of the foot and take the stress away from plantar fascial.  Patient agrees with the plan like to  proceed with orthotics -Patient was casted for orthotics   Procedure: Injection Tendon/Ligament Location: Right plantar fascia at the glabrous junction; medial approach. Skin Prep: alcohol Injectate: 0.5 cc 0.5% marcaine plain, 0.5 cc of 1% Lidocaine, 0.5 cc kenalog 10. Disposition: Patient tolerated procedure well. Injection site dressed with a band-aid.  No follow-ups on file.

## 2023-01-24 ENCOUNTER — Other Ambulatory Visit: Payer: Self-pay | Admitting: Podiatry

## 2023-01-24 DIAGNOSIS — M722 Plantar fascial fibromatosis: Secondary | ICD-10-CM

## 2023-01-24 DIAGNOSIS — M79671 Pain in right foot: Secondary | ICD-10-CM

## 2023-02-01 ENCOUNTER — Other Ambulatory Visit: Payer: Self-pay | Admitting: Podiatry

## 2023-02-01 DIAGNOSIS — Q666 Other congenital valgus deformities of feet: Secondary | ICD-10-CM

## 2023-03-07 ENCOUNTER — Ambulatory Visit: Payer: 59 | Admitting: Podiatry

## 2023-03-07 DIAGNOSIS — Q666 Other congenital valgus deformities of feet: Secondary | ICD-10-CM

## 2023-03-07 DIAGNOSIS — M722 Plantar fascial fibromatosis: Secondary | ICD-10-CM | POA: Diagnosis not present

## 2023-03-07 NOTE — Progress Notes (Signed)
Subjective:  Patient ID: Cindy Le, female    DOB: 03-29-1981,  MRN: [id number]  Chief Complaint  Patient presents with   Plantar Fasciitis    Pt stated that she is still having discomfort     42 y.o. female presents with the above complaint.  Patient presents for follow-up of continuous Planter fasciitis pain.  Patient states is painful to touch.  The injection helped some but she still has some residual pain.  She is also here to pick up orthotics.   Review of Systems: Negative except as noted in the HPI. Denies N/V/F/Ch.  Past Medical History:  Diagnosis Date   Eczema    Hypertension    Obesity    Thyroid disease     Current Outpatient Medications:    amLODipine (NORVASC) 5 MG tablet, Take 5 mg by mouth daily., Disp: , Rfl:    augmented betamethasone dipropionate (DIPROLENE-AF) 0.05 % cream, Apply 1 application. topically 2 (two) times daily as needed (eczema)., Disp: , Rfl:    fluocinonide-emollient (LIDEX-E) 0.05 % cream, Apply 1 application. topically See admin instructions. Every time hands are washed, Disp: , Rfl:    ibuprofen (ADVIL) 100 MG/5ML suspension, Take 30 mLs (600 mg total) by mouth every 6 (six) hours as needed., Disp: 3600 mL, Rfl: 1   levothyroxine (SYNTHROID) 175 MCG tablet, Take 175 mcg by mouth daily before breakfast., Disp: , Rfl:    oxyCODONE (ROXICODONE) 5 MG/5ML solution, Take 5-10 mLs (5-10 mg total) by mouth every 4 (four) hours as needed for moderate pain., Disp: 60 mL, Rfl: 0  Social History   Tobacco Use  Smoking Status Never  Smokeless Tobacco Never    No Known Allergies Objective:  There were no vitals filed for this visit. There is no height or weight on file to calculate BMI. Constitutional Well developed. Well nourished.  Vascular Dorsalis pedis pulses palpable bilaterally. Posterior tibial pulses palpable bilaterally. Capillary refill normal to all digits.  No cyanosis or clubbing noted. Pedal hair growth normal.   Neurologic Normal speech. Oriented to person, place, and time. Epicritic sensation to light touch grossly present bilaterally.  Dermatologic Nails well groomed and normal in appearance. No open wounds. No skin lesions.  Orthopedic: Normal joint ROM without pain or crepitus bilaterally. No visible deformities. Tender to palpation at the midfoot right. No pain with calcaneal squeeze right. Ankle ROM diminished range of motion right. Silfverskiold Test: positive right.  Gait examination shows semiflexible pes planovalgus deformity with calcaneovalgus to many toe signs partially able to recruit the arch with dorsiflexion of the hallux unable to perform single and double heel raise   Radiographs: 3 views of skeletally mature adult right foot: Plantar heel spurring noted positive Haglund's deformity noted.  No other bony abnormalities identified.  No arthritis noted.  Assessment:   1. Pes planovalgus   2. Plantar fasciitis of right foot     Plan:  Patient was evaluated and treated and all questions answered.  Plantar Fasciitis, right midfoot - XR reviewed as above.  - Educated on icing and stretching. Instructions given.  -Second injection delivered to the plantar fascia as below. - DME: Plantar fascial brace dispensed to support the medial longitudinal arch of the foot and offload pressure from the heel and prevent arch collapse during weightbearing - Pharmacologic management: None  Pes planovalgus -I explained to patient the etiology of pes planovalgus and relationship with Planter fasciitis and various treatment options were discussed.  Given patient foot structure in the setting  of Planter fasciitis I believe patient will benefit from custom-made orthotics to help control the hindfoot motion support the arch of the foot and take the stress away from plantar fascial.  Patient agrees with the plan like to proceed with orthotics -Orthotics were dispensed and they are functioning  well   Procedure: Injection Tendon/Ligament Location: Right plantar fascia at the glabrous junction; medial approach. Skin Prep: alcohol Injectate: 0.5 cc 0.5% marcaine plain, 0.5 cc of 1% Lidocaine, 0.5 cc kenalog 10. Disposition: Patient tolerated procedure well. Injection site dressed with a band-aid.  No follow-ups on file.

## 2023-11-26 IMAGING — MR MR PELVIS WO/W CM
12 of 18 series · 30 of 48 positions shown · IV contrast (19 ML MULTIHANCE)
Comparison: None.

CLINICAL DATA: Fibroids.  Treatment planning.

EXAM:
MRI PELVIS WITHOUT AND WITH CONTRAST
TECHNIQUE: Multiplanar multisequence MR imaging of the pelvis was performed
both before and after administration of intravenous contrast.
CONTRAST:  19mL MULTIHANCE GADOBENATE DIMEGLUMINE 529 MG/ML IV SOLN

[Series 3: T2 · coronal · 6.0mm · 1.56mm/px · 1 of 30 slices shown (1 of 4)]
[im 1/30]
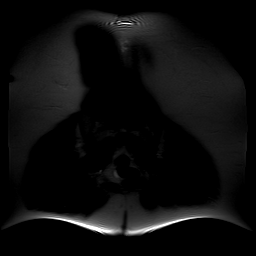

[Series 4: T2 · axial · 6.0mm · 0.94mm/px · 1 of 30 slices shown (2 of 4)]
[im 1/30]
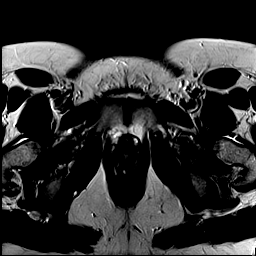

[Series 5: T2 fat-sat · axial · 6.0mm · 0.94mm/px · 1 of 30 slices shown]
[im 1/30]
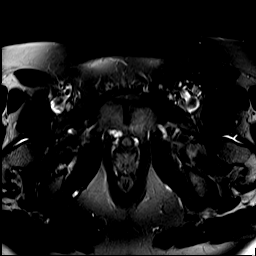

[Series 6: T2 · sagittal · 5.0mm · 0.98mm/px · 2 of 30 slices shown (3 of 4)]
[im 1/30]
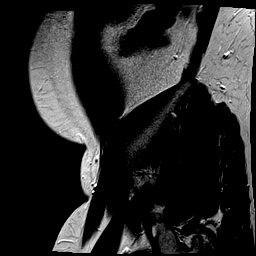
[im 30/30]
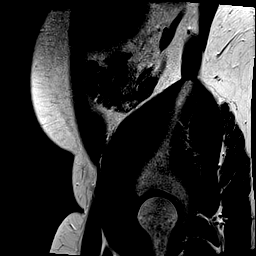

[Series 7: T2 · coronal · 6.0mm · 0.88mm/px · 2 of 30 slices shown (4 of 4)]
[im 1/30]
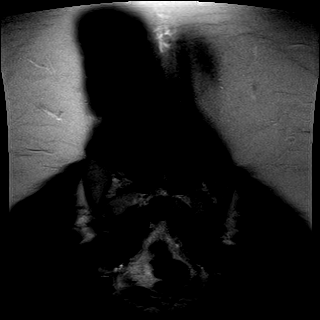
[im 30/30]
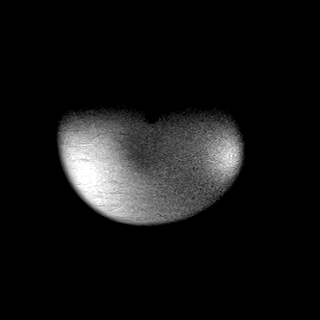

[Series 8: DWI · axial · 6.0mm · 1.72mm/px · z∈[-95,+157]mm · 6 of 108 slices shown]
[im 1/108]
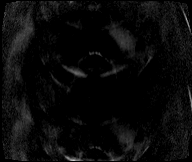
[im 22/108]
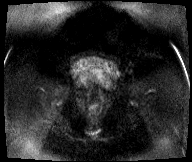
[im 43/108]
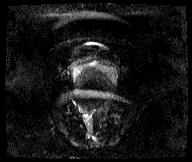
[im 65/108]
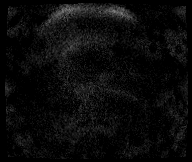
[im 86/108]
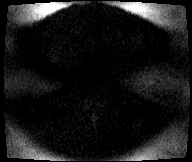
[im 108/108]
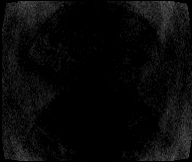

[Series 9: axial dwi_adc · axial · 6.0mm · 1.72mm/px · z∈[-95,+157]mm · 2 of 36 slices shown]
[im 1/36]
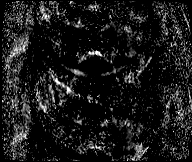
[im 36/36]
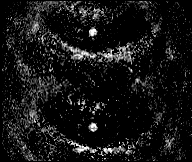

[Series 10: axial in out · axial · 6.5mm · 0.74mm/px · z∈[-93,+169]mm · 4 of 72 slices shown]
[im 1/72]
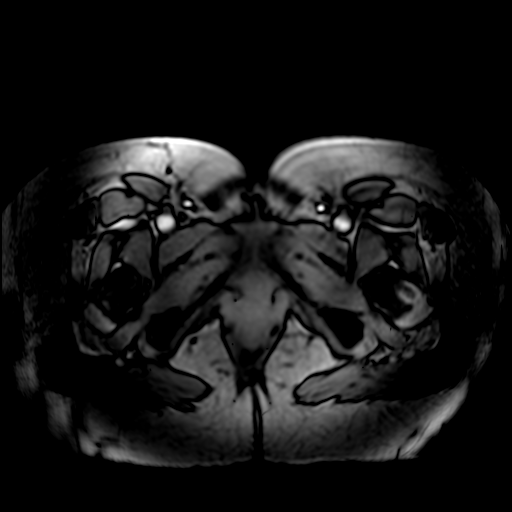
[im 24/72]
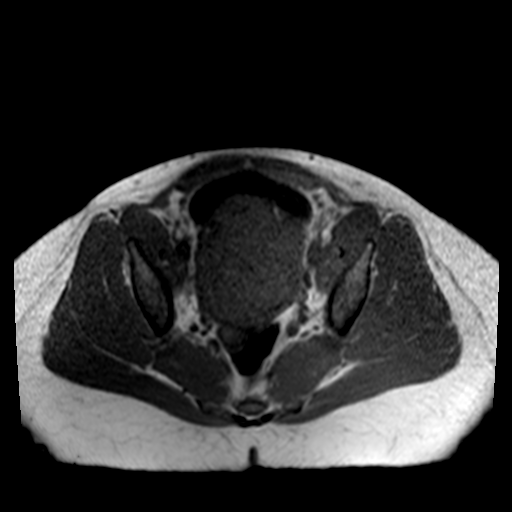
[im 48/72]
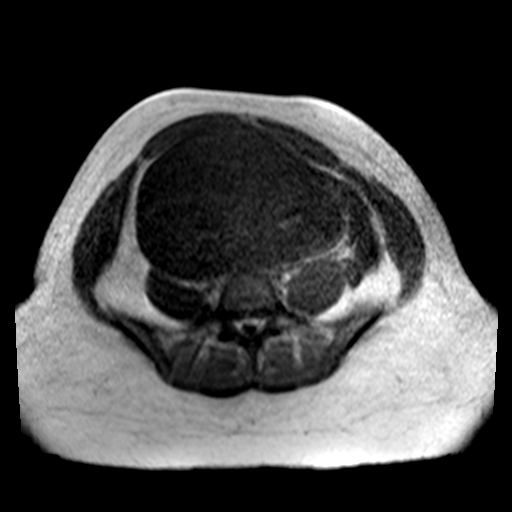
[im 72/72]
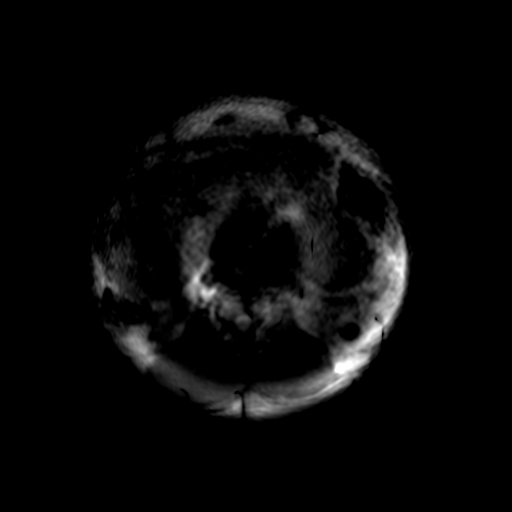

[Series 11: T1 dynamic · axial · non-contrast · 4.0mm · 0.49mm/px · z∈[-63,+141]mm · 3 of 52 slices shown (1 of 2)]
[im 1/52]
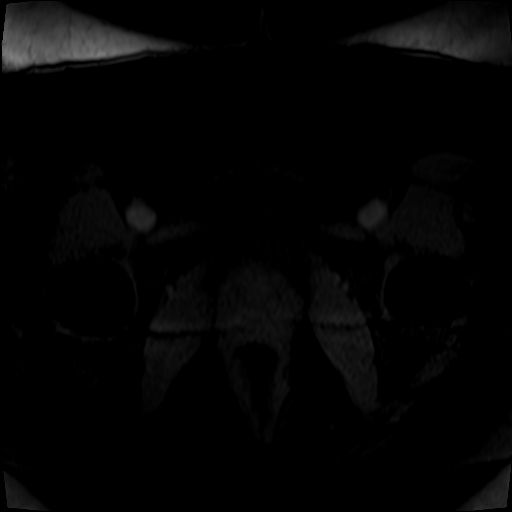
[im 26/52]
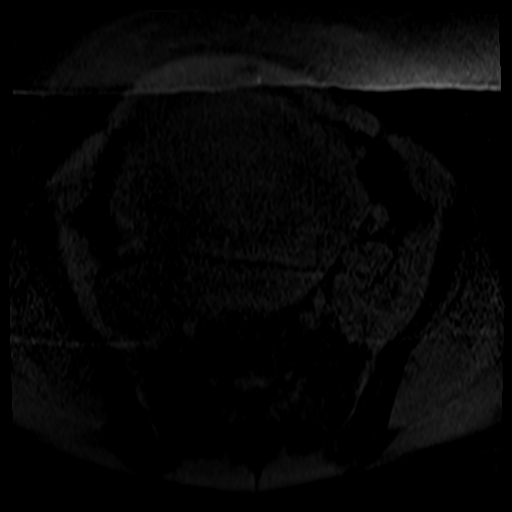
[im 52/52]
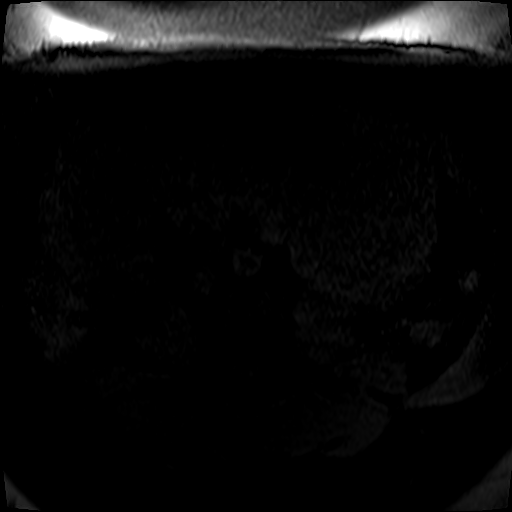

[Series 12: T1 dynamic post-contrast · axial · 4.0mm · 0.49mm/px · z∈[-63,+141]mm · 3 of 52 slices shown (1 of 2)]
[im 1/52]
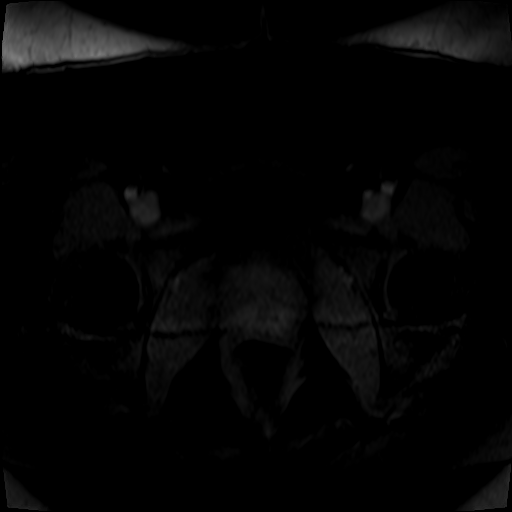
[im 26/52]
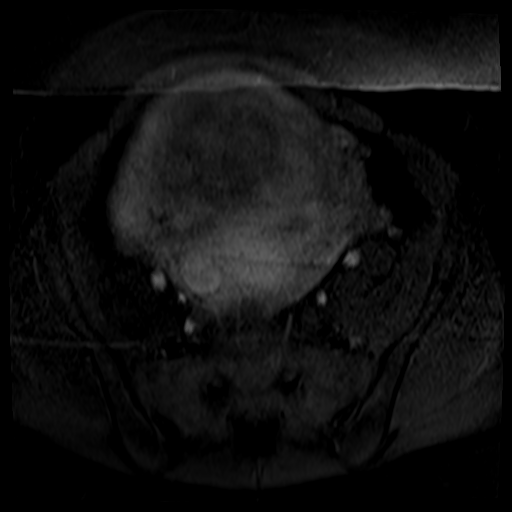
[im 52/52]
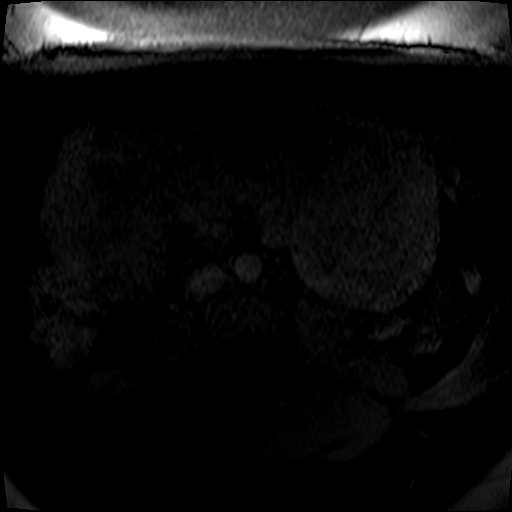

[Series 13: T1 dynamic · axial · 4.0mm · 0.49mm/px · z∈[-63,+141]mm · 3 of 52 slices shown (2 of 2)]
[im 1/52]
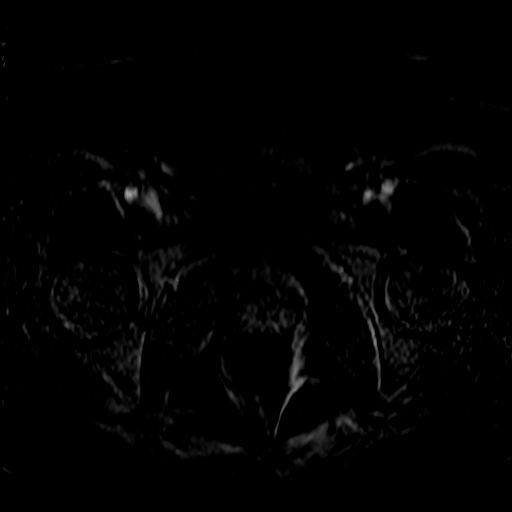
[im 26/52]
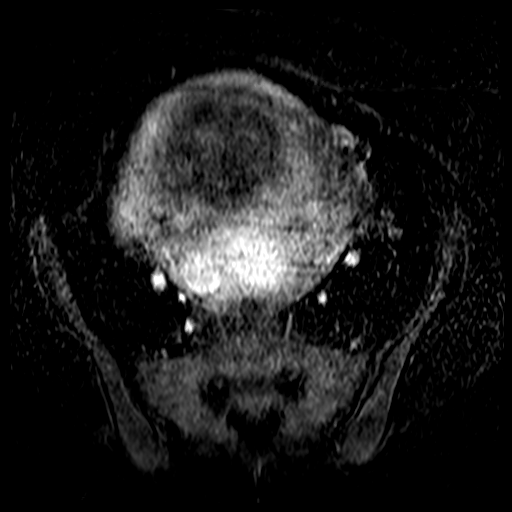
[im 52/52]
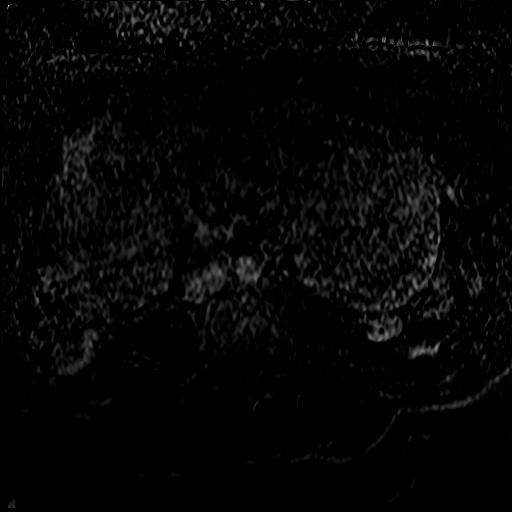

[Series 14: T1 dynamic post-contrast · axial · 4.0mm · 0.49mm/px · z∈[-63,+37]mm · 2 of 52 slices shown (2 of 2)]
[im 1/52]
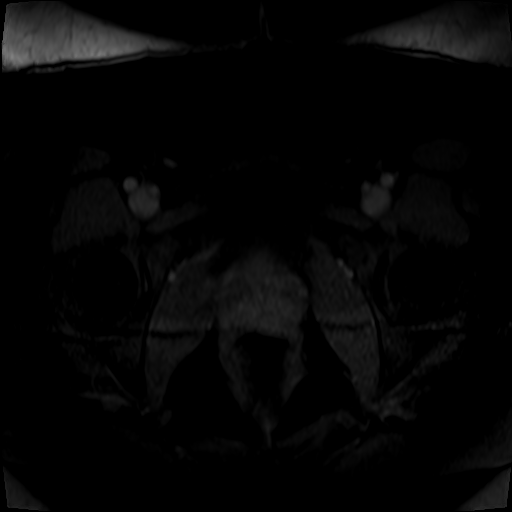
[im 26/52]
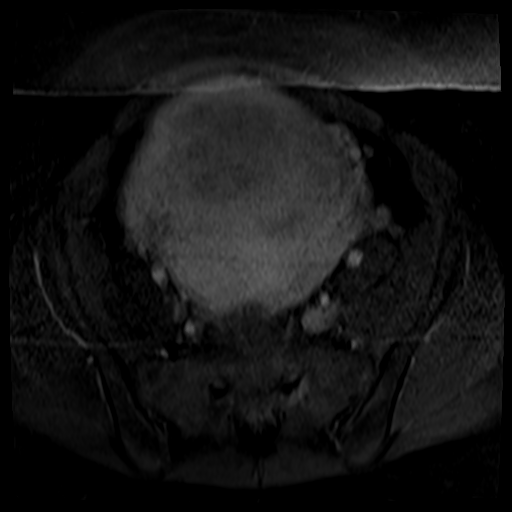

[30 of 48 positions shown; findings below may reference images not displayed]

FINDINGS: Lower Urinary Tract: No bladder or urethral abnormality identified.

Bowel:  Unremarkable visualized pelvic bowel loops.

Vascular/Lymphatic: No pathologically enlarged lymph nodes or other
significant abnormality.

Reproductive:

-- Uterus: Measures 21.0 x 11.3 by 15.6 cm (volume = 3913 cm^3).
Two dominant fibroids are seen which are intramural in location.
These are located in the right anterior fundus measuring 12.4 cm in
maximum diameter, and the posterior lower uterine corpus measuring
7.5 cm in maximum diameter.

Additional small intramural fibroid is seen in the left posterior
corpus measuring 2.1 cm. Small subserosal fibroids are seen in the
right lateral upper uterine corpus measuring 3.1 cm, and the
posterior lower uterine segment measuring 2.8 cm. A 2.8 cm
submucosal fibroid is also seen in the right posterior upper uterine
corpus.

Endometrial thickness measures 6 mm. The cervix is displaced
anteriorly by the fibroid in the posterior lower uterine segment
described above, but is otherwise normal in appearance.

-- Intracavitary fibroids:  None.

-- Pedunculated fibroids: None.

-- Fibroid contrast enhancement: All fibroids show contrast
enhancement, without significant degeneration/devascularization.

-- Right ovary:  Appears normal.  No mass identified.

-- Left ovary:  Appears normal.  No mass identified.

Other: No abnormal free fluid.

Musculoskeletal:  Unremarkable.
IMPRESSION: Markedly enlarged uterus with multiple fibroids, as described above.
No intracavitary or pedunculated fibroids identified.

Normal appearance of both ovaries. No adnexal mass identified.

## 2024-02-28 ENCOUNTER — Ambulatory Visit (INDEPENDENT_AMBULATORY_CARE_PROVIDER_SITE_OTHER): Admitting: Podiatry

## 2024-02-28 DIAGNOSIS — Z91199 Patient's noncompliance with other medical treatment and regimen due to unspecified reason: Secondary | ICD-10-CM

## 2024-02-29 NOTE — Progress Notes (Signed)
   Complete physical exam  Patient: Cindy Le   DOB: 09/02/2001   43 y.o. Female  MRN: [id number]  Subjective:    No chief complaint on file.   Cindy Le is a 43 y.o. female who presents today for a complete physical exam. She reports consuming a {diet types:17450} diet. {types:19826} She generally feels {DESC; WELL/FAIRLY WELL/POORLY:18703}. She reports sleeping {DESC; WELL/FAIRLY WELL/POORLY:18703}. She {does/does not:200015} have additional problems to discuss today.    Most recent fall risk assessment:    05/10/2022   10:42 AM  Fall Risk   Falls in the past year? 0  Number falls in past yr: 0  Injury with Fall? 0  Risk for fall due to : No Fall Risks  Follow up Falls evaluation completed     Most recent depression screenings:    05/10/2022   10:42 AM 03/31/2021   10:46 AM  PHQ 2/9 Scores  PHQ - 2 Score 0 0  PHQ- 9 Score 5     {VISON DENTAL STD PSA (Optional):27386}  {History (Optional):23778}  Patient Care Team: Christen Butter, NP as PCP - General (Nurse Practitioner)   Outpatient Medications Prior to Visit  Medication Sig   fluticasone (FLONASE) 50 MCG/ACT nasal spray Place 2 sprays into both nostrils in the morning and at bedtime. After 7 days, reduce to once daily.   norgestimate-ethinyl estradiol (SPRINTEC 28) 0.25-35 MG-MCG tablet Take 1 tablet by mouth daily.   Nystatin POWD Apply liberally to affected area 2 times per day   spironolactone (ALDACTONE) 100 MG tablet Take 1 tablet (100 mg total) by mouth daily.   No facility-administered medications prior to visit.    ROS        Objective:     There were no vitals taken for this visit. {Vitals History (Optional):23777}  Physical Exam   No results found for any visits on 06/15/22. {Show previous labs (optional):23779}    Assessment & Plan:    Routine Health Maintenance and Physical Exam  Immunization History  Administered Date(s) Administered   DTaP 11/16/1999, 01/12/2000,  03/22/2000, 12/06/2000, 06/21/2004   Hepatitis A 04/17/2008, 04/23/2009   Hepatitis B 09/03/1999, 10/11/1999, 03/22/2000   HiB (PRP-OMP) 11/16/1999, 01/12/2000, 03/22/2000, 12/06/2000   IPV 11/16/1999, 01/12/2000, 09/10/2000, 06/21/2004   Influenza,inj,Quad PF,6+ Mos 07/24/2014   Influenza-Unspecified 10/23/2012   MMR 09/10/2001, 06/21/2004   Meningococcal Polysaccharide 04/22/2012   Pneumococcal Conjugate-13 12/06/2000   Pneumococcal-Unspecified 03/22/2000, 06/05/2000   Tdap 04/22/2012   Varicella 09/10/2000, 04/17/2008    Health Maintenance  Topic Date Due   HIV Screening  Never done   Hepatitis C Screening  Never done   INFLUENZA VACCINE  06/13/2022   PAP-Cervical Cytology Screening  06/15/2022 (Originally 09/01/2020)   PAP SMEAR-Modifier  06/15/2022 (Originally 09/01/2020)   TETANUS/TDAP  06/15/2022 (Originally 04/22/2022)   HPV VACCINES  Discontinued   COVID-19 Vaccine  Discontinued    Discussed health benefits of physical activity, and encouraged her to engage in regular exercise appropriate for her age and condition.  Problem List Items Addressed This Visit   None Visit Diagnoses     Annual physical exam    -  Primary   Cervical cancer screening       Need for Tdap vaccination          No follow-ups on file.     Christen Butter, NP

## 2024-07-30 ENCOUNTER — Other Ambulatory Visit: Payer: Self-pay | Admitting: Obstetrics & Gynecology

## 2024-07-30 DIAGNOSIS — R1030 Lower abdominal pain, unspecified: Secondary | ICD-10-CM

## 2024-08-06 ENCOUNTER — Ambulatory Visit
Admission: RE | Admit: 2024-08-06 | Discharge: 2024-08-06 | Disposition: A | Discharge status: Home / Self Care | Source: Ambulatory Visit | Attending: Obstetrics & Gynecology | Admitting: Obstetrics & Gynecology

## 2024-08-06 DIAGNOSIS — R1030 Lower abdominal pain, unspecified: Secondary | ICD-10-CM

## 2024-08-06 MED ORDER — IOPAMIDOL (ISOVUE-300) INJECTION 61%
100.0000 mL | Freq: Once | INTRAVENOUS | Status: AC | PRN
  Administered 2024-08-06: 100 mL via INTRAVENOUS
